# Patient Record
Sex: Male | Born: 1965 | Race: Black or African American | Hispanic: No | Marital: Married | Smoking: Never smoker
Health system: Southern US, Community
[De-identification: ages and names within clinical notes are randomized; demographics above are authoritative.]

## PROBLEM LIST (undated history)

## (undated) DIAGNOSIS — K3184 Gastroparesis: Secondary | ICD-10-CM

## (undated) DIAGNOSIS — Z8679 Personal history of other diseases of the circulatory system: Secondary | ICD-10-CM

## (undated) DIAGNOSIS — N529 Male erectile dysfunction, unspecified: Secondary | ICD-10-CM

---

## 1999-07-30 ENCOUNTER — Emergency Department (HOSPITAL_COMMUNITY): Admission: EM | Admit: 1999-07-30 | Discharge: 1999-07-30 | Payer: Self-pay | Admitting: Emergency Medicine

## 2005-04-23 ENCOUNTER — Emergency Department (HOSPITAL_COMMUNITY): Admission: EM | Admit: 2005-04-23 | Discharge: 2005-04-23 | Payer: Self-pay | Admitting: Emergency Medicine

## 2011-01-14 ENCOUNTER — Emergency Department (HOSPITAL_COMMUNITY)
Admission: EM | Admit: 2011-01-14 | Discharge: 2011-01-15 | Disposition: A | Discharge status: Home / Self Care | Payer: 59 | Attending: Emergency Medicine | Admitting: Emergency Medicine

## 2011-01-14 DIAGNOSIS — R109 Unspecified abdominal pain: Secondary | ICD-10-CM | POA: Insufficient documentation

## 2011-01-15 ENCOUNTER — Emergency Department (HOSPITAL_COMMUNITY): Payer: 59

## 2011-01-15 LAB — URINALYSIS, ROUTINE W REFLEX MICROSCOPIC
Bilirubin Urine: NEGATIVE
Hgb urine dipstick: NEGATIVE
Ketones, ur: 15 mg/dL — AB
Nitrite: NEGATIVE
Protein, ur: NEGATIVE mg/dL
Urobilinogen, UA: 1 mg/dL (ref 0.0–1.0)

## 2011-01-15 LAB — CBC
Hemoglobin: 16.1 g/dL (ref 13.0–17.0)
MCH: 27.7 pg (ref 26.0–34.0)
Platelets: 303 10*3/uL (ref 150–400)
RBC: 5.82 MIL/uL — ABNORMAL HIGH (ref 4.22–5.81)
WBC: 6.3 10*3/uL (ref 4.0–10.5)

## 2011-01-15 LAB — TROPONIN I: Troponin I: 0.01 ng/mL (ref 0.00–0.06)

## 2011-01-15 LAB — COMPREHENSIVE METABOLIC PANEL
ALT: 19 U/L (ref 0–53)
Albumin: 3.9 g/dL (ref 3.5–5.2)
Alkaline Phosphatase: 66 U/L (ref 39–117)
BUN: 10 mg/dL (ref 6–23)
CO2: 24 mEq/L (ref 19–32)
Calcium: 9.3 mg/dL (ref 8.4–10.5)
GFR calc Af Amer: >60 mL/min (ref >60)
Glucose, Bld: 123 mg/dL — ABNORMAL HIGH (ref 70–99)

## 2011-01-15 LAB — URINE MICROSCOPIC-ADD ON

## 2011-01-15 LAB — DIFFERENTIAL
Eosinophils Relative: 2 % (ref 0–5)
Lymphs Abs: 1.2 10*3/uL (ref 0.7–4.0)
Neutrophils Relative %: 67 % (ref 43–77)

## 2011-01-15 LAB — CK TOTAL AND CKMB (NOT AT ARMC)
CK, MB: 0.7 ng/mL (ref 0.3–4.0)
Relative Index: 0.7 (ref 0.0–2.5)
Total CK: 107 U/L (ref 7–232)

## 2011-01-15 LAB — LIPASE, BLOOD: Lipase: 18 U/L (ref 11–59)

## 2011-01-17 ENCOUNTER — Ambulatory Visit (HOSPITAL_COMMUNITY)
Admission: AD | Admit: 2011-01-17 | Discharge: 2011-01-21 | Disposition: A | Discharge status: Home / Self Care | Payer: 59 | Source: Ambulatory Visit | Attending: Surgery | Admitting: Surgery

## 2011-01-17 ENCOUNTER — Ambulatory Visit (HOSPITAL_COMMUNITY): Admission: AD | Admit: 2011-01-17 | Payer: Self-pay | Source: Ambulatory Visit | Admitting: Surgery

## 2011-01-17 ENCOUNTER — Other Ambulatory Visit: Payer: Self-pay | Admitting: Surgery

## 2011-01-17 DIAGNOSIS — K560 Paralytic ileus: Secondary | ICD-10-CM | POA: Insufficient documentation

## 2011-01-17 DIAGNOSIS — K812 Acute cholecystitis with chronic cholecystitis: Secondary | ICD-10-CM | POA: Insufficient documentation

## 2011-01-17 DIAGNOSIS — I10 Essential (primary) hypertension: Secondary | ICD-10-CM | POA: Insufficient documentation

## 2011-01-17 DIAGNOSIS — R1011 Right upper quadrant pain: Secondary | ICD-10-CM | POA: Insufficient documentation

## 2011-01-17 DIAGNOSIS — R112 Nausea with vomiting, unspecified: Secondary | ICD-10-CM | POA: Insufficient documentation

## 2011-01-17 DIAGNOSIS — E785 Hyperlipidemia, unspecified: Secondary | ICD-10-CM | POA: Insufficient documentation

## 2011-01-17 DIAGNOSIS — Z01812 Encounter for preprocedural laboratory examination: Secondary | ICD-10-CM | POA: Insufficient documentation

## 2011-01-17 HISTORY — PX: LAPAROSCOPIC CHOLECYSTECTOMY: SUR755

## 2011-01-17 LAB — SURGICAL PCR SCREEN: MRSA, PCR: NEGATIVE

## 2011-01-19 LAB — COMPREHENSIVE METABOLIC PANEL
ALT: 51 U/L (ref 0–53)
AST: 25 U/L (ref 0–37)
Calcium: 8.5 mg/dL (ref 8.4–10.5)
Creatinine, Ser: 1.16 mg/dL (ref 0.4–1.5)
GFR calc Af Amer: >60 mL/min (ref >60)
Glucose, Bld: 90 mg/dL (ref 70–99)
Sodium: 137 mEq/L (ref 135–145)
Total Protein: 5.9 g/dL — ABNORMAL LOW (ref 6.0–8.3)

## 2011-01-19 LAB — CBC
MCH: 27.3 pg (ref 26.0–34.0)
RBC: 5.49 MIL/uL (ref 4.22–5.81)
RDW: 12.8 % (ref 11.5–15.5)
WBC: 7.1 10*3/uL (ref 4.0–10.5)

## 2011-01-20 ENCOUNTER — Ambulatory Visit (HOSPITAL_COMMUNITY): Payer: 59

## 2011-01-22 ENCOUNTER — Emergency Department (HOSPITAL_COMMUNITY): Payer: 59

## 2011-01-22 ENCOUNTER — Emergency Department (HOSPITAL_COMMUNITY)
Admission: EM | Admit: 2011-01-22 | Discharge: 2011-01-22 | Disposition: A | Discharge status: Home / Self Care | Payer: 59 | Attending: Emergency Medicine | Admitting: Emergency Medicine

## 2011-01-22 DIAGNOSIS — E7800 Pure hypercholesterolemia, unspecified: Secondary | ICD-10-CM | POA: Insufficient documentation

## 2011-01-22 DIAGNOSIS — R1032 Left lower quadrant pain: Secondary | ICD-10-CM | POA: Insufficient documentation

## 2011-01-22 DIAGNOSIS — Z9089 Acquired absence of other organs: Secondary | ICD-10-CM | POA: Insufficient documentation

## 2011-01-22 DIAGNOSIS — R112 Nausea with vomiting, unspecified: Secondary | ICD-10-CM | POA: Insufficient documentation

## 2011-01-22 LAB — CBC
MCH: 27.1 pg (ref 26.0–34.0)
MCHC: 34.2 g/dL (ref 30.0–36.0)
MCV: 79.3 fL (ref 78.0–100.0)
Platelets: 233 10*3/uL (ref 150–400)
RDW: 12.5 % (ref 11.5–15.5)
WBC: 9.9 10*3/uL (ref 4.0–10.5)

## 2011-01-22 LAB — URINALYSIS, ROUTINE W REFLEX MICROSCOPIC
Ketones, ur: >80 mg/dL — AB
Nitrite: NEGATIVE
Protein, ur: NEGATIVE mg/dL

## 2011-01-22 LAB — DIFFERENTIAL
Lymphs Abs: 0.5 10*3/uL — ABNORMAL LOW (ref 0.7–4.0)
Monocytes Absolute: 1.1 10*3/uL — ABNORMAL HIGH (ref 0.1–1.0)
Monocytes Relative: 11 % (ref 3–12)
Neutrophils Relative %: 84 % — ABNORMAL HIGH (ref 43–77)

## 2011-01-22 LAB — BASIC METABOLIC PANEL
BUN: 14 mg/dL (ref 6–23)
Creatinine, Ser: 0.83 mg/dL (ref 0.4–1.5)
GFR calc non Af Amer: >60 mL/min (ref >60)

## 2011-01-22 MED ORDER — IOHEXOL 300 MG/ML  SOLN
100.0000 mL | Freq: Once | INTRAMUSCULAR | Status: AC | PRN
  Administered 2011-01-22: 100 mL via INTRAVENOUS

## 2011-01-23 NOTE — H&P (Signed)
Logan Arnold, Logan Arnold              ACCOUNT NO.:  [id number]  MEDICAL RECORD NO.:  [id number]           PATIENT TYPE:  O  LOCATION:  1536                         FACILITY:  [facility]  PHYSICIAN:  Abigail Miyamoto, M.D. DATE OF BIRTH:  1966/08/03  DATE OF ADMISSION:  01/17/2011 DATE OF DISCHARGE:                             HISTORY & PHYSICAL   CHIEF COMPLAINT:  Right upper quadrant abdominal pain, nausea, and vomiting.  HISTORY:  This is a 45 year old gentleman who presents with several week history of right upper quadrant abdominal pain, nausea, and vomiting. He actually presented to the emergency department 2 days ago with the same complaint.  He was then sent to Dr. Charna Elizabeth today.  After he was evaluated by Dr. Loreta Ave, Dr. Loreta Ave called me with concerns that [name] may have cholecystitis based on his examination.  He was therefore sent over to short stay.  The patient reports again he has had pain in the right upper quadrant, which has been intermittent over the last several weeks.  He has had nausea and vomiting with this.  He has been on antacid medications which has not relieved his symptoms.  He denies any jaundice with this.  The pain could be severe at times and again is intermittent and sharp.  Otherwise he is without complaints.  PAST MEDICAL HISTORY:  High cholesterol and hypertension.  PAST SURGICAL HISTORY:  Negative.  MEDICATIONS:  Lipitor.  ALLERGIES:  No known drug allergies.  SOCIAL HISTORY:  He does not smoke.  He drinks alcohol on a social basis.  FAMILY HISTORY:  Significant for diabetes and hypertension.  REVIEW OF SYSTEMS:  GENERAL:  Negative for fevers or chills.  PULMONARY: Negative for cough, shortness breath or difficulty breathing.  CARDIAC: Negative chest pain or irregular heartbeat.  ABDOMEN:  Listed as above. There is no hematemesis.  He has had diarrhea.  There is no blood in the stool.  Urinary is negative for dysuria or hematuria.  The  rest of review of systems of skin, eyes, ears, nose, and throat, musculoskeletal, neurologic, psychiatric, endocrine are normal.  PHYSICAL EXAMINATION:  GENERAL:  This is a  well-developed, well- nourished gentleman in obvious discomfort. VITAL SIGNS:  Temperature 97.0, pulse 105, blood pressure 154/107, respiratory rate 18.  He is saturating 98% on room air. HEENT:  Eyes anicteric.  Pupils reactive bilaterally.  ENT, external ears and nose normal.  Hearing is normal.  Oropharynx clear. NECK:  Supple.  Trachea is midline.  There is no thyromegaly. LUNGS:  Clear to auscultation bilaterally.  There is normal respiratory effort. CARDIOVASCULAR:  Tachycardic with regular rhythm.  There are no murmurs. There is no peripheral edema. ABDOMEN:  Soft.  There is tenderness with guarding which is moderate in the right upper quadrant.  The rest of the abdomen is soft and nontender.  There are no hernias.  There is no organomegaly. SKIN:  Shows no rashes, no jaundice. EXTREMITIES:  Warm, well perfused.  No edema, clubbing, or cyanosis. Peripheral pulses are intact throughout.  DATA REVIEWED:  I have the labs from 2 days ago which shows a normal white blood count  of 6.3, hemoglobin of 16.1, lipase is normal, bilirubin is 0.6, alkaline phosphatase 66, AST 10, ALT 19.  The patient has an ultrasound of the gallbladder showing to have gallbladder sludge. There are no other abnormalities.  IMPRESSION:  This is a patient with cholecystitis based on history and physical examination.  At this point, I would recommend a laparoscopic cholecystectomy and possible cholangiogram.  I have discussed this with the patient in detail.  I have discussed the risks of surgery which include but not limited to bleeding, infection, bile duct injury, bile leak, injury to other structures, need for conversion to an open procedure, and the chance that it may not resolve all his symptoms.  He understands and wished to  proceed.  Surgery will thus be performed.     Abigail Miyamoto, M.D.     DB/MEDQ  D:  01/17/2011  T:  01/17/2011  Job:  [id number]  cc:   Anselmo Rod, MD, [facility] Fax: [phone]  Electronically Signed by Abigail Miyamoto M.D. on 01/23/2011 01:56:15 PM

## 2011-01-23 NOTE — Op Note (Signed)
  NAMEMINER, KORAL              ACCOUNT NO.:  [id number]  MEDICAL RECORD NO.:  [id number]           PATIENT TYPE:  O  LOCATION:  DAYL                         FACILITY:  [facility]  PHYSICIAN:  Abigail Miyamoto, M.D. DATE OF BIRTH:  1966-03-04  DATE OF PROCEDURE:  01/17/2011 DATE OF DISCHARGE:                              OPERATIVE REPORT   PREOPERATIVE DIAGNOSIS:  Cholecystitis.  POSTOPERATIVE DIAGNOSIS:  Cholecystitis.  PROCEDURE:  Laparoscopic cholecystectomy.  SURGEON:  Abigail Miyamoto, M.D.  ASSISTANT:  Eber Hong, P.A.  ANESTHESIA:  General endotracheal anesthesia.  ESTIMATED BLOOD LOSS:  Minimal.  FINDINGS:  The patient was found to have acute cholecystitis with thickened gallbladder wall.  PROCEDURE IN DETAIL:  The patient was brought to the operative room, identified as Logan Arnold.  He was placed supine on the operating table and general anesthesia was induced.  His abdomen was then prepped and draped in the usual sterile fashion.  Using #15 blade, a small vertical incision was made below the umbilicus.  This was carried down to fascia which was then opened with a scalpel.  A hemostat was used to pass through the peritoneal cavity under direct vision.  Next, 0 Vicryl pursestring sutures were placed around the fascial opening.  The Hasson port was placed through the opening and insufflation of the abdomen was begun.  A 5-mm port was placed to the patient's epigastrium and 2 more to the right upper quadrant, all under direct vision.  The gallbladder was then identified, grasped and retracted above the liver bed. Dissection was then carried out at the base of the gallbladder.  The gallbladder itself was found be acutely inflamed with edematous wall. The cystic duct was easily dissected out along with the cystic artery and a critical window was achieved around both.  I clipped the cystic duct 3 times proximally and the artery 3 times proximally and then  both twice distally and transected both the cystic duct and the cystic artery.  The gallbladder was then slowly dissected free from the liver bed with electrocautery.  Once it was freed from the liver bed, it was placed in an Endosac and removed through the incision at the umbilicus. With 0 Vicryl, the umbilicus was then tied in place closing the fascial defect.  I again examined liver bed, hemostasis was felt to be achieved. I then thoroughly irrigated the abdomen with normal saline.  All ports were removed under direct vision and the abdomen was deflated.  All incisions were anesthetized with Marcaine and closed with 4-0 Monocryl subcuticular sutures.  Steri-Strips and Band-Aids were then applied.  The patient the procedure well.  All counts were correct at the end of procedure.  The patient was then extubated in the operating room and taken in a stable condition to recovery room.     Abigail Miyamoto, M.D.     DB/MEDQ  D:  01/17/2011  T:  01/17/2011  Job:  [id number]  Electronically Signed by Abigail Miyamoto M.D. on 01/23/2011 01:56:17 PM

## 2011-01-28 ENCOUNTER — Emergency Department (HOSPITAL_COMMUNITY): Payer: 59

## 2011-01-28 ENCOUNTER — Observation Stay (HOSPITAL_COMMUNITY)
Admission: EM | Admit: 2011-01-28 | Discharge: 2011-02-01 | Disposition: A | Discharge status: Home / Self Care | Payer: 59 | Attending: Internal Medicine | Admitting: Internal Medicine

## 2011-01-28 DIAGNOSIS — E785 Hyperlipidemia, unspecified: Secondary | ICD-10-CM | POA: Insufficient documentation

## 2011-01-28 DIAGNOSIS — R112 Nausea with vomiting, unspecified: Secondary | ICD-10-CM | POA: Insufficient documentation

## 2011-01-28 DIAGNOSIS — K219 Gastro-esophageal reflux disease without esophagitis: Secondary | ICD-10-CM | POA: Insufficient documentation

## 2011-01-28 DIAGNOSIS — E872 Acidosis, unspecified: Secondary | ICD-10-CM | POA: Insufficient documentation

## 2011-01-28 DIAGNOSIS — Z9889 Other specified postprocedural states: Secondary | ICD-10-CM | POA: Insufficient documentation

## 2011-01-28 DIAGNOSIS — K3184 Gastroparesis: Principal | ICD-10-CM | POA: Insufficient documentation

## 2011-01-28 LAB — COMPREHENSIVE METABOLIC PANEL
Albumin: 3.6 g/dL (ref 3.5–5.2)
Alkaline Phosphatase: 75 U/L (ref 39–117)
BUN: 23 mg/dL (ref 6–23)
Calcium: 9.5 mg/dL (ref 8.4–10.5)
Potassium: 4.4 mEq/L (ref 3.5–5.1)
Total Protein: 6.8 g/dL (ref 6.0–8.3)

## 2011-01-28 LAB — CBC
MCHC: 35.3 g/dL (ref 30.0–36.0)
MCV: 79.2 fL (ref 78.0–100.0)
Platelets: 252 10*3/uL (ref 150–400)
RDW: 12.8 % (ref 11.5–15.5)
WBC: 5.7 10*3/uL (ref 4.0–10.5)

## 2011-01-28 LAB — DIFFERENTIAL
Basophils Absolute: 0 10*3/uL (ref 0.0–0.1)
Eosinophils Absolute: 0.2 10*3/uL (ref 0.0–0.7)
Eosinophils Relative: 3 % (ref 0–5)
Lymphs Abs: 1 10*3/uL (ref 0.7–4.0)
Monocytes Absolute: 1.3 10*3/uL — ABNORMAL HIGH (ref 0.1–1.0)

## 2011-01-28 LAB — LIPASE, BLOOD: Lipase: 30 U/L (ref 11–59)

## 2011-01-28 LAB — URINALYSIS, ROUTINE W REFLEX MICROSCOPIC
Glucose, UA: NEGATIVE mg/dL
Ketones, ur: 15 mg/dL — AB
Nitrite: NEGATIVE
Specific Gravity, Urine: 1.029 (ref 1.005–1.030)
pH: 6 (ref 5.0–8.0)

## 2011-01-28 LAB — LACTIC ACID, PLASMA: Lactic Acid, Venous: 0.7 mmol/L (ref 0.5–2.2)

## 2011-01-28 MED ORDER — TECHNETIUM TC 99M MEBROFENIN IV KIT
5.0000 | PACK | Freq: Once | INTRAVENOUS | Status: AC | PRN
  Administered 2011-01-28: 5 via INTRAVENOUS

## 2011-01-29 LAB — COMPREHENSIVE METABOLIC PANEL
ALT: 48 U/L (ref 0–53)
AST: 13 U/L (ref 0–37)
Albumin: 2.4 g/dL — ABNORMAL LOW (ref 3.5–5.2)
CO2: 26 mEq/L (ref 19–32)
Chloride: 103 mEq/L (ref 96–112)
Creatinine, Ser: 1 mg/dL (ref 0.4–1.5)
GFR calc Af Amer: >60 mL/min (ref >60)
GFR calc non Af Amer: >60 mL/min (ref >60)
Potassium: 3.5 mEq/L (ref 3.5–5.1)
Sodium: 136 mEq/L (ref 135–145)
Total Bilirubin: 0.4 mg/dL (ref 0.3–1.2)

## 2011-01-29 LAB — CBC
Hemoglobin: 13.9 g/dL (ref 13.0–17.0)
Platelets: 203 10*3/uL (ref 150–400)
RBC: 5.13 MIL/uL (ref 4.22–5.81)
WBC: 4.9 10*3/uL (ref 4.0–10.5)

## 2011-01-29 NOTE — H&P (Signed)
Logan Arnold, Logan Arnold              ACCOUNT NO.:  [id number]  MEDICAL RECORD NO.:  [id number]           PATIENT TYPE:  E  LOCATION:  MCED                         FACILITY:  [facility]  PHYSICIAN:  Lonia Blood, M.D.       DATE OF BIRTH:  Feb 18, 1966  DATE OF ADMISSION:  01/28/2011 DATE OF DISCHARGE:                             HISTORY & PHYSICAL   PRIMARY CARE PHYSICIAN:  Tally Joe, M.D. with [facility]  CHIEF COMPLAINT:  Nausea and vomiting.  HISTORY OF PRESENT ILLNESS:  Logan Arnold is a 45 year old gentleman who has had a fairly eventful past 2 weeks.  He started with some significant nausea and vomiting green bile 1 week prior to January 17, 2011.  He went and saw Dr. Loreta Ave in the office and was referred to the hospital for possible acute cholecystitis.  He was admitted on January 17, 2011, underwent an abdominal ultrasound on January 15, 2011 and there were no suggestions of cholecystitis.  He continued to have significant nausea and vomiting with green bile.  On January 18, 2011, he underwent laparoscopic cholecystectomy.  Postoperatively, he had some minor improvement in his symptoms and he was eventually successfully discharge on January 21, 2011.  He developed some minor postoperative ileus, but overall he was successfully discharged home.  He presented back to the emergency room on Jan 22, 2011, had a CT scan of abdomen and pelvis which was negative for any acute intraabdominal pathology namely any gallbladder fossa abscess.  He then went on to present today with persistent 1 week of nausea, vomiting, and inability to eat any food and underwent a hepatobiliary HIDA scan which was negative for a bile leak. As he was unable to eat, he was referred to [place] for further diagnostic investigations and observation.  The patient reports that he has no significant past medical history.  He does not drink and does not smoke and that he has some epigastric pain nonradiating, 5/10, associated with this  persistent nausea and vomiting.  PAST MEDICAL HISTORY:  Hyperlipidemia.  HOME MEDICATIONS:  Lipitor.  SOCIAL HISTORY:  He is a Emergency planning/management officer, does not smoke, does not drink significantly, does not use drugs.  FAMILY HISTORY:  Mother is alive at age 26 with Parkinson.  He has a history of breast cancer.  Father is aged 12, alive with diabetes, hypertension.  One brother and two sisters alive and well.  REVIEW OF SYSTEMS:  As per HPI.  All other systems reviewed are negative.  PHYSICAL EXAMINATION:  VITAL SIGNS:  Upon admission, temperature 98.1, blood pressure 140/90, pulse 117, respirations 22, saturation 97% on room air. GENERAL APPEARANCE:  This is a well known well-developed, well-nourished gentleman in some moderate distress due to his persistent nausea who is lying on the stretcher.  He is alert and oriented to place, person, and time. HEENT:  Head is normocephalic, atraumatic.  Eyes:  PERRL, equal, round and reactive to light and accommodation.  Extraocular movements are intact.  Buccal mucosa dry. NECK:  Supple.  No JVD. CHEST:  Clear to auscultation.  No wheezes.  No crackles. HEART:  Regular rate and  rhythm, without murmurs, rubs or gallops. ABDOMEN:  Soft.  There is some epigastric tenderness without rebound or guarding.  Bowel sounds are diminished. EXTREMITIES:  Lower extremities are without edema. SKIN:  Warm and dry without suspicious looking rashes. NEUROLOGIC:  Cranial nerves III through XII intact. Strength 4/5 in all four extremities.  Sensation intact.  LABORATORY VALUES ON ADMISSION:  White blood cell count 5.7, hemoglobin 18.8, platelet count is 252.  Sodium is 131, potassium 4.4, chloride 95, bicarbonate 19, BUN 23, creatinine 1.1, calcium 9.5, albumin 3.6, lipase 30.  ALT 103, albumin 3.6, calcium 9.5.  Urinalysis positive for ketones.  Acute abdominal series shows a clear chest.  No free air.  A review of the film indicates significant constipation  with desiccated stool is present throughout the colon.  ASSESSMENT AND PLAN:  This is a 45 year old gentleman who presents with significant persistent nausea and epigastric pain.  His objective abnormalities also include hyponatremia, dehydration, metabolic acidosis with an anion gap of 14, severe constipation, and hyponatremia.  I think at this point in time, the differential diagnosis includes severe constipation with pseudoobstruction versus gastritis/peptic ulcer disease, rule out esophagitis.  I doubt that this is cholangitis or pancreatitis.  I also strongly doubt presence of appendicitis.  I think the sensible thing to do for this patient tonight is admit him to the hospital, hydrate vigorously with IV fluids, place him on proton-pump inhibitor namely Protonix intravenously and give him MiraLax 34 g.  He is going to be seen in consultation by Dr. Loreta Ave from Gastroenterology who is going to prep him for an endoscopy tomorrow.     Lonia Blood, M.D.     SL/MEDQ  D:  01/28/2011  T:  01/28/2011  Job:  [id number]  cc:   Tally Joe, M.D. Anselmo Rod, MD, [facility]  Electronically Signed by Lonia Blood M.D. on 01/29/2011 05:48:05 PM

## 2011-01-30 ENCOUNTER — Observation Stay (HOSPITAL_COMMUNITY): Payer: 59

## 2011-01-30 LAB — BASIC METABOLIC PANEL
CO2: 31 mEq/L (ref 19–32)
Calcium: 8.5 mg/dL (ref 8.4–10.5)
Creatinine, Ser: 0.9 mg/dL (ref 0.4–1.5)
GFR calc Af Amer: >60 mL/min (ref >60)
GFR calc non Af Amer: >60 mL/min (ref >60)
Glucose, Bld: 88 mg/dL (ref 70–99)
Sodium: 136 mEq/L (ref 135–145)

## 2011-01-30 LAB — CBC
HCT: 44.7 % (ref 39.0–52.0)
Hemoglobin: 15.3 g/dL (ref 13.0–17.0)
MCH: 27.8 pg (ref 26.0–34.0)
MCHC: 34.2 g/dL (ref 30.0–36.0)
RDW: 12.7 % (ref 11.5–15.5)

## 2011-01-30 NOTE — Discharge Summary (Signed)
  Logan Arnold, DEREMER              ACCOUNT NO.:  [id number]  MEDICAL RECORD NO.:  [id number]           PATIENT TYPE:  O  LOCATION:  1536                         FACILITY:  [facility]  PHYSICIAN:  Abigail Miyamoto, M.D. DATE OF BIRTH:  [birth year]  DATE OF ADMISSION:  01/17/2011 DATE OF DISCHARGE:                              DISCHARGE SUMMARY   DATE OF EXPECTED DISCHARGE:  January 18, 2011.  ADMISSION DIAGNOSIS:  Acute cholecystitis.  DISCHARGING DIAGNOSIS:  Acute cholecystitis.  ADDITIONAL DIAGNOSES:  Dyslipidemia and hypertension.  PAST SURGICAL HISTORY:  None.  MEDICATIONS ON ADMISSION:  Lipitor  ALLERGIES:  None known.  BRIEF HISTORY:  The patient is a 45 year old gentleman who had several weeks of right upper quadrant abdominal pain, nausea, and vomiting.  He actually presented to the emergency room 2 days ago with the same complaint.  He was sent to Dr. Charna Elizabeth for evaluation and was seen in her office on the day of admission.  She contacted Dr. Magnus Ivan who felt that he had significant discomfort.  The patient was subsequently transferred to [facility] and admitted as an outpatient for elective cholecystectomy.  HOSPITAL COURSE:  The patient was admitted, taken directly to the OR where he underwent laparoscopic cholecystectomy.  The patient tolerated the procedure well.  He returned to the floor in satisfactory condition. He has been hemodynamically stable with blood pressures borderline elevated.  He had some clear liquids this morning and had significant nausea and vomiting.  He felt better after vomiting and Zofran. Currently, he is pain-free.  His nausea and vomiting does not seem to be related to his pain medicine.  We are going to reintroduce liquids, mobilize him and advance his diet as tolerated.  If he does well, we will discharge later today.  His wounds are steri-stripped and at this point the dressings are clean and dry.  DISCHARGE MEDS:  We  will send him home on Vicodin 1 to 2 q.4 p.r.n. for pain, ibuprofen 200 mg 2 to 3 tablets q.6 p.r.n. or Tylenol 325 two tablets q.4 p.r.n. for pain.  He will follow up with Dr. Azucena Cecil at his convenience.  We will have him follow up with Dr. Magnus Ivan in the office in 2 to 3 weeks.  CONDITION ON DISCHARGE:  Improved.  FOLLOWUP:  Followup appointment will be Jan 30, 2011, at 3:40 with Dr. Magnus Ivan.     Eber Hong, P.A.   ______________________________ Abigail Miyamoto, M.D.    WDJ/MEDQ  D:  01/18/2011  T:  01/18/2011  Job:  [id number]  cc:   Tally Joe, M.D. Fax: [phone]  WGNFAO ZHY QMVH, MD, [facility] Fax: [phone]  Electronically Signed by Sherrie George P.A. on 01/26/2011 08:10:30 PM Electronically Signed by Abigail Miyamoto M.D. on 01/30/2011 10:37:03 AM

## 2011-01-31 ENCOUNTER — Observation Stay (HOSPITAL_COMMUNITY): Payer: 59

## 2011-01-31 LAB — COMPREHENSIVE METABOLIC PANEL
BUN: 8 mg/dL (ref 6–23)
CO2: 28 mEq/L (ref 19–32)
Calcium: 8.7 mg/dL (ref 8.4–10.5)
Creatinine, Ser: 0.97 mg/dL (ref 0.4–1.5)
GFR calc non Af Amer: >60 mL/min (ref >60)
Glucose, Bld: 94 mg/dL (ref 70–99)
Total Protein: 5.7 g/dL — ABNORMAL LOW (ref 6.0–8.3)

## 2011-01-31 LAB — CBC
HCT: 45 % (ref 39.0–52.0)
Hemoglobin: 15.2 g/dL (ref 13.0–17.0)
MCH: 27.1 pg (ref 26.0–34.0)
MCHC: 33.8 g/dL (ref 30.0–36.0)
RDW: 12.7 % (ref 11.5–15.5)

## 2011-01-31 LAB — HIV ANTIBODY (ROUTINE TESTING W REFLEX): HIV: NONREACTIVE

## 2011-01-31 MED ORDER — TECHNETIUM TC 99M SULFUR COLLOID: 2.0000 | Freq: Once | INTRAVENOUS | Status: AC | PRN

## 2011-02-01 LAB — VITAMIN B12: Vitamin B-12: 1980 pg/mL — ABNORMAL HIGH (ref 211–911)

## 2011-02-05 ENCOUNTER — Other Ambulatory Visit: Payer: Self-pay | Admitting: Gastroenterology

## 2011-02-05 ENCOUNTER — Ambulatory Visit
Admission: RE | Admit: 2011-02-05 | Discharge: 2011-02-05 | Disposition: A | Discharge status: Home / Self Care | Payer: 59 | Source: Ambulatory Visit | Attending: Gastroenterology | Admitting: Gastroenterology

## 2011-02-05 DIAGNOSIS — R1110 Vomiting, unspecified: Secondary | ICD-10-CM

## 2011-02-05 NOTE — Discharge Summary (Signed)
  Logan Arnold, Logan Arnold              ACCOUNT NO.:  [id number]  MEDICAL RECORD NO.:  [id number]           PATIENT TYPE:  O  LOCATION:  1536                         FACILITY:  [facility]  PHYSICIAN:  Ardeth Sportsman, MD     DATE OF BIRTH:  [birth year]  DATE OF ADMISSION:  01/17/2011 DATE OF DISCHARGE:  01/21/2011                              DISCHARGE SUMMARY   ADDENDUM: This is an addendum of the discharge summary dictated on January 18, 2011.  ADMISSION DIAGNOSIS:  Acute cholecystitis.  DISCHARGE DIAGNOSES: 1. Acute cholecystitis. 2. Postoperative ileus. 3. Dyslipidemia. 4. Hypertension.  HOSPITAL COURSE:  The patient was admitted, underwent laparoscopic cholecystectomy on April 26.  We anticipated discharge on April 27, but postoperatively he had some nausea and vomiting, was unable to eat.  We kept him n.p.o. and tried him on clear liquids.  He had problems with ongoing nausea & vomiting.    He was ambulating well.  His wounds looked good.  Labs were repeated on January 19, 2011, white count was normal at 7.1, hemoglobin 15, hematocrit 44.  Electrolytes were normal.  LFTs were also normal. Lipase was negative at 23.  Underwent a flat plate, & abdominal ultrasound on January 20, 2011.  This showed no discrete fluid collection in gallbladder fossa.  The bile duct was 7.7 mm in diameter with no obstruction, and there was no free fluid.  The flat plate was also normal.  The patient subsequently got his diet advanced again.  This morning, he was able to eat a soft diet. His post-op ileus was resolving. He still had not had a bowel movement.  We gave him a laxative, he successfully had a bowel movement and by lunch time was ready for discharge.  He will be discharged on Vicodin 1 to 2 q.4, ibuprofen 2 to 3 tablets q.6 or Tylenol 2 tablets q.4 p.r.n. for pain.  He will follow up with Dr. Magnus Ivan on Jan 28, 2011 at 3:40.  He can resume regular diet, shower, remove the Steri-Strips in 1 week.  He  can go back to work in 1 week.     Logan Arnold, P.A.   ______________________________ Ardeth Sportsman, MD    WDJ/MEDQ  D:  01/21/2011  T:  01/21/2011  Job:  [id number]  cc:   Anselmo Rod, MD, [facility] Fax: [phone]  Electronically Signed by Sherrie George P.A. on 01/26/2011 08:18:00 PM Electronically Signed by Karie Soda MD on 02/05/2011 12:03:43 PM

## 2011-02-11 NOTE — Consult Note (Signed)
Logan Arnold, Logan Arnold              ACCOUNT NO.:  [id number]  MEDICAL RECORD NO.:  [id number]           PATIENT TYPE:  LOCATION:                                 FACILITY:  PHYSICIAN:  Jordan Hawks. Elnoria Howard, MD    DATE OF BIRTH:  11-26-1965  DATE OF CONSULTATION:  01/29/2011 DATE OF DISCHARGE:                                CONSULTATION   REASON FOR CONSULTATION:  Epigastric pain, nausea and vomiting, and 25- pound weight loss.  HISTORY OF PRESENT ILLNESS:  This is a 45 year old gentleman without any significant past medical history who started to have acute symptoms of nausea, vomiting, and abdominal pain; and on January 17, 2011, the patient's symptoms started acutely; and at that time, he was evaluated by Dr. Loreta Ave and was thought to have an acute cholecystitis. Unfortunately, workup was negative for this issue, but subsequently he did undergo a laparoscopic cholecystectomy.  Unfortunately, the procedure did not resolve his symptoms and he continued to have this type of abdominal pain, nausea, and vomiting.  Per the patient's recollection, he believes that he has lost approximately 25 pounds as a result of his symptoms.  There are no complaints of any dysphagia or odynophagia.  No fevers or chills.  He also had a postoperative HIDA scan which was negative for any evidence of biliary leak.  The pain is intermittent and it can worsen without any provocation.  At times, he will have issues with singultus which causes an epigastric type pain. Currently, he does not have any pain.  Past medical history and past surgical history is significant for hyperlipidemia.  SOCIAL HISTORY:  Negative for alcohol, tobacco, illicit drug use.  He is a Emergency planning/management officer.  REVIEW OF SYSTEMS:  As stated above in history present illness, otherwise negative.  MEDICATIONS: 1. Protonix 40 mg IV q.12 h. 2. MiraLax 34 mg p.o. daily. 3. Tylenol 650 mg n.p.o. q.4 h. p.r.n. 4. Dilaudid 0.5 to 1 mg IV q.4 h.  p.r.n. 5. Zofran 4 mg IV q.6 h. p.r.n. 6. Phenergan 12.5 mg IV q.6 h. p.r.n.  ALLERGIES:  No known drug allergies.  PHYSICAL EXAMINATION:  VITAL SIGNS:  Blood pressure is 149/87, heart rate 73, respirations 20, temperature is 98.3. GENERAL:  The patient is in no acute distress, alert and oriented. HEENT:  Normocephalic, atraumatic.  Extraocular muscles intact. NECK:  Supple.  No lymphadenopathy. LUNGS:  Clear to auscultation bilaterally. CARDIOVASCULAR:  Regular rate and rhythm. ABDOMEN:  Flat, soft.  No significant tenderness in the epigastric region.  No rebound or rigidity. EXTREMITIES:  No clubbing, cyanosis, or edema.  LABORATORY VALUES:  White blood cell count is 4.9, hemoglobin 13.9, MCV is 81.9, platelets at 203.  Sodium is 136, potassium 3.5, chloride 103, CO2 26, glucose 84, BUN 10, creatinine is 1.0.  Total bilirubin 0.4, alk phos 54, AST 13, ALT 48, and albumin 2.4.  IMPRESSION: 1. Nausea and vomiting. 2. Epigastric pain. 3. Weight loss.  Unfortunately, the etiology of his symptoms is unknown at this time despite extensive workup.  The imaging scans did not reveal any overt findings to clarify  this issue.  He  has not had an endoscopy at this time; and after discussion with Dr. Loreta Ave, the plan is to perform an EGD.  PLAN:  EGD and further recommendations pending the findings.     Jordan Hawks Elnoria Howard, MD     PDH/MEDQ  D:  01/29/2011  T:  01/30/2011  Job:  [id number]  Electronically Signed by Jeani Hawking MD on 02/11/2011 06:49:04 AM

## 2011-03-09 ENCOUNTER — Emergency Department (HOSPITAL_COMMUNITY): Payer: 59

## 2011-03-09 ENCOUNTER — Observation Stay (HOSPITAL_COMMUNITY)
Admission: EM | Admit: 2011-03-09 | Discharge: 2011-03-11 | Disposition: A | Discharge status: Home / Self Care | Payer: 59 | Attending: Internal Medicine | Admitting: Internal Medicine

## 2011-03-09 DIAGNOSIS — S0300XA Dislocation of jaw, unspecified side, initial encounter: Secondary | ICD-10-CM | POA: Insufficient documentation

## 2011-03-09 DIAGNOSIS — X58XXXA Exposure to other specified factors, initial encounter: Secondary | ICD-10-CM | POA: Insufficient documentation

## 2011-03-09 DIAGNOSIS — E785 Hyperlipidemia, unspecified: Secondary | ICD-10-CM | POA: Insufficient documentation

## 2011-03-09 DIAGNOSIS — R109 Unspecified abdominal pain: Secondary | ICD-10-CM | POA: Insufficient documentation

## 2011-03-09 DIAGNOSIS — J690 Pneumonitis due to inhalation of food and vomit: Secondary | ICD-10-CM | POA: Insufficient documentation

## 2011-03-09 DIAGNOSIS — K3184 Gastroparesis: Principal | ICD-10-CM | POA: Insufficient documentation

## 2011-03-09 LAB — HEPATIC FUNCTION PANEL
ALT: 22 U/L (ref 0–53)
Alkaline Phosphatase: 267 U/L — ABNORMAL HIGH (ref 39–117)
Bilirubin, Direct: 0.2 mg/dL (ref 0.0–0.3)
Indirect Bilirubin: 0.3 mg/dL (ref 0.3–0.9)
Total Protein: 7 g/dL (ref 6.0–8.3)

## 2011-03-09 LAB — LIPASE, BLOOD: Lipase: 53 U/L (ref 11–59)

## 2011-03-09 LAB — POCT I-STAT, CHEM 8
BUN: 6 mg/dL (ref 6–23)
Calcium, Ion: 1.06 mmol/L — ABNORMAL LOW (ref 1.12–1.32)
Glucose, Bld: 128 mg/dL — ABNORMAL HIGH (ref 70–99)
TCO2: 24 mmol/L (ref 0–100)

## 2011-03-10 LAB — LIPID PANEL
HDL: 32 mg/dL — ABNORMAL LOW (ref >39)
LDL Cholesterol: 60 mg/dL (ref 0–99)

## 2011-03-11 ENCOUNTER — Inpatient Hospital Stay (HOSPITAL_COMMUNITY): Payer: 59

## 2011-03-11 LAB — COMPREHENSIVE METABOLIC PANEL
AST: 9 U/L (ref 0–37)
BUN: 4 mg/dL — ABNORMAL LOW (ref 6–23)
CO2: 27 mEq/L (ref 19–32)
Calcium: 8 mg/dL — ABNORMAL LOW (ref 8.4–10.5)
Chloride: 98 mEq/L (ref 96–112)
Creatinine, Ser: 0.51 mg/dL (ref 0.50–1.35)
GFR calc Af Amer: >60 mL/min (ref >60)
GFR calc non Af Amer: >60 mL/min (ref >60)
Glucose, Bld: 71 mg/dL (ref 70–99)
Total Bilirubin: 0.5 mg/dL (ref 0.3–1.2)

## 2011-03-11 LAB — DIFFERENTIAL
Eosinophils Absolute: 0.1 10*3/uL (ref 0.0–0.7)
Lymphocytes Relative: 12 % (ref 12–46)
Lymphs Abs: 0.6 10*3/uL — ABNORMAL LOW (ref 0.7–4.0)
Monocytes Relative: 14 % — ABNORMAL HIGH (ref 3–12)
Neutro Abs: 3.4 10*3/uL (ref 1.7–7.7)
Neutrophils Relative %: 71 % (ref 43–77)

## 2011-03-11 LAB — CBC
HCT: 35.9 % — ABNORMAL LOW (ref 39.0–52.0)
Hemoglobin: 11.8 g/dL — ABNORMAL LOW (ref 13.0–17.0)
MCH: 26.9 pg (ref 26.0–34.0)
MCV: 81.8 fL (ref 78.0–100.0)
Platelets: 225 10*3/uL (ref 150–400)
RBC: 4.39 MIL/uL (ref 4.22–5.81)
WBC: 4.7 10*3/uL (ref 4.0–10.5)

## 2011-03-11 LAB — PHOSPHORUS: Phosphorus: 2.9 mg/dL (ref 2.3–4.6)

## 2011-03-12 NOTE — H&P (Signed)
Logan Arnold, Logan Arnold NO.:  [id number]  MEDICAL RECORD NO.:  [id number]  LOCATION:  [facility]                         FACILITY:  [facility]  PHYSICIAN:  Letha Cape, MD         DATE OF BIRTH:  [birth year]  DATE OF ADMISSION:  03/09/2011 DATE OF DISCHARGE:                             HISTORY & PHYSICAL   HISTORY OF PRESENT ILLNESS:  A 45 year old male with a 48-month history of severe gastroparesis following cholecystectomy, resulting in recurrent nausea and vomiting.  This morning, the patient was vomiting and resulted in an jaw dislocation.  The patient presented to the emergency room secondary to this.  While in the ER, the emergency room physician was able to put his jaw back in place.  They have been attempted to have him drink some liquids.  The patient was unable to tolerate this and began vomiting again.  The patient's wife reports that this all started about 2 months ago following cholecystectomy.  Afterthat, he developed severe gastroparesis and has had recurrent episodes of this.  He has lost about 30 pounds in the last few months.  He is followed by Gastroenterology and is attempting to get in to see a specialist at [facility].  PAST MEDICAL HISTORY:  Gastroparesis, hyperlipidemia.  ALLERGIES:  No known drug allergies.  MEDICATIONS: 1. Zofran 4 mg p.o. q.6 h p.r.n. 2. Protonix 40 mg p.o. daily. 3. Reglan 5 mg p.o. daily. 4. Lipitor 10 mg p.o. daily.  PAST SURGICAL HISTORY:  Cholecystectomy.  FAMILY HISTORY:  Noncontributory.  SOCIAL HISTORY:  He is married and lives at home with his wife.  Drinks occasionally, but has not since March.  No tobacco or other drug use. He is a prior Nurse, adult.  PHYSICAL EXAMINATION:  VITAL SIGNS:  Blood pressure 145/84, heart rate 92, respirations 14, oxygen saturation 100% on room air. GENERAL:  This is a well-appearing male, in no acute distress. HEENT:  Pupils equal, round, and reactive to light.  Bandage is  wrapped around his head, keeping his jaw in place. CARDIOVASCULAR:  Regular rate and rhythm, no murmurs, rubs, or gallops. LUNGS:  Clear to auscultation bilaterally. ABDOMEN:  Soft, nondistended, positive bowel sounds, tenderness to palpation in the left upper quadrant. EXTREMITIES:  No cyanosis, clubbing, or edema. NEURO:  Grossly intact.  LABORATORY DATA:  Hemoglobin 16.3, hematocrit 48.  Sodium 135, potassium 3.9, chloride 99, glucose 128, BUN 6, creatinine 0.8.  Ionized calcium 1.06.  RADIOLOGY:  X-ray was shows TMJ dislocation.  Follow up x-ray shows correction of this.  ASSESSMENT:  A 45 year old gentleman with severe gastroparesis resulting in jaw dislocation, now with continued nausea and vomiting. 1. Nausea and vomiting.  We will treat with antiemetics as needed.     The patient needs to stay on a liquid diet given the jaw     dislocation at this time. 2. Hyperlipidemia.  Check a fasting lipid panel.  The patient likely     does not need his home Lipitor, now that he has lost 30 pounds     secondary to gastroparesis.3. Gastroparesis.  Continue antiemetics as needed.  The patient needs     some further workup, which can be continued as  an outpatient. 4. Disposition.  Patient is stable for discharge once able to tolerate     p.o. without vomiting.          ______________________________ Letha Cape, MD     RW/MEDQ  D:  03/09/2011  T:  03/09/2011  Job:  [id number]  Electronically Signed by Letha Cape MD on 03/12/2011 12:43:31 PM

## 2011-03-15 NOTE — Discharge Summary (Signed)
[name], [name]:  [id number]  MEDICAL RECORD NO.:  [id number]  LOCATION:  5503                         FACILITY:  [facility]  PHYSICIAN:  Thad Ranger, MD       DATE OF BIRTH:  Feb 27, 1966  DATE OF ADMISSION:  03/09/2011 DATE OF DISCHARGE:  03/11/2011                        DISCHARGE SUMMARY - REFERRING   PRIMARY CARE PHYSICIAN:  Tally Joe, MD  DISCHARGE DIAGNOSES: 1. Nausea and vomiting secondary to gastroparesis, resolved. 2. Hyperlipidemia. 3. Pain secondary to gastroparesis. 4. Aspiration pneumonia. 5. Jaw dislocation secondary to nausea, vomiting, and severe     gastroparesis.  DISCHARGE MEDICATIONS: 1. Augmentin 500 mg p.o. b.i.d. for 7 days. 2. Vicodin 5/325 mg 1 tablet p.o. every 8 hours as needed for pain. 3. Phenergan 12.5 mg p.o. every 6 hours as needed for nausea/vomiting. 4. Tramadol 50 mg 1-2 tablets p.o. every 8 hours as needed for pain. 5. Reglan 5 mg p.o. t.i.d. before meals. 6. Pantoprazole 40 mg p.o. daily. 7. Zofran 4 mg p.o. every 6 hours as needed for nausea.  BRIEF HISTORY OF PRESENT ILLNESS:  [name] is a 45 year old male with 48-month history of severe gastroparesis following cholecystectomy resulting in recurrent nausea and vomiting.  On the day of admission, on March 09, 2011, the patient was vomiting and resulted in jaw dislocation. The patient presented in the emergency room secondary to this.  While in the emergency room, the ER physician was able to put his jaw back in place.  The patient was unable to tolerate liquids at the time of admission.  The patient's wife reported that this all started about 2 months prior to admission following cholecystectomy, after that he developed severe gastroparesis and had recurrent episodes of this, he lost about 30 pounds in the last few months.  He is followed by Gastroenterology and attempting to see specialist at [facility].  RADIOLOGICAL DATA:  Orthopantogram on March 09, 2011,  anteroinferior dislocation of the mandibular condyles bilaterally with respect to the temporomandibular joint, no fracture line is seen.  Orthopantogram on March 09, 2011, after the reduction of mandible dislocation, the mandibular condyles are now both normally located in the condylar fossa. Abdominal x-ray on June 18, showed abnormal density at the right lung base which could represent pneumonia, benign-appearing abdomen.  PERTINENT LAB/DIAGNOSTIC DATA:  Lipase 53.  Lipid profile, cholesterol 105, HDL 32, LDL 60.  BRIEF HOSPITALIZATION COURSE:  [name] is a 45 year old male who with severe gastroparesis, presented with jaw dislocation and nausea/vomiting. 1. Jaw dislocation secondary to nausea and vomiting.  This was placed     back by the ER physician with before and after orthopantograms.     The patient is currently tolerating a regular diet without any     difficulty. 2. Hyperlipidemia.  Lipid panel was checked.  The patient was on     Lipitor, however, he has lost 30 pounds secondary to gastroparesis.     The patient's cholesterol level and LDL are within normal range.     Lipitor can be discontinued at this time. 3. Gastroparesis.  The patient was continued on antiemetics and pain     control.  He will continue to follow his primary care  physician and     Gastroenterology, Dr. Jeani Hawking, outpatient and [facility]. 4. Aspiration pneumonia, likely secondary to nausea and vomiting.  The     patient is not having any leukocytosis or fevers or chills.     However, I will complete the course of antibiotics for aspiration     pneumonitis.  PHYSICAL EXAMINATION:  VITAL SIGNS:  At the time of discharge, temperature 98.0, pulse 100, respirations 18, blood pressure 127/86, and O2 sats 94%. GENERAL:  The patient is alert, awake, and oriented x3.  No acute distress. HEENT:  Anicteric sclerae.  Pink conjunctivae.  Pupils reactive to light and accommodation.   EOMI. NECK:  Supple.  No lymphopathy.  No JVD. CVS:  S1 and S2 clear.  Regular rate and rhythm. CHEST:  Clear to auscultation bilaterally. ABDOMEN:  Soft, nontender, nondistended.  Normal bowel sounds. EXTREMITIES:  No cyanosis, clubbing, or edema noted in upper and lower extremities bilaterally.  Discharge follow up with Dr. Tally Joe in next 7-10 days and Gastroenterology within next 7-10 days.  Discharge time, 35 minutes.     Thad Ranger, MD     RR/MEDQ  D:  03/11/2011  T:  03/11/2011  Job:  [id number]  cc:   Tally Joe, M.D. Jordan Hawks Elnoria Howard, MD  Electronically Signed by Andres Labrum Elieser Tetrick  on 03/15/2011 05:08:05 PM

## 2011-04-21 ENCOUNTER — Emergency Department (HOSPITAL_COMMUNITY): Payer: 59

## 2011-04-21 ENCOUNTER — Emergency Department (HOSPITAL_COMMUNITY)
Admission: EM | Admit: 2011-04-21 | Discharge: 2011-04-21 | Disposition: A | Discharge status: Home / Self Care | Payer: 59 | Attending: Emergency Medicine | Admitting: Emergency Medicine

## 2011-04-21 DIAGNOSIS — K3184 Gastroparesis: Secondary | ICD-10-CM | POA: Insufficient documentation

## 2011-04-21 DIAGNOSIS — E7800 Pure hypercholesterolemia, unspecified: Secondary | ICD-10-CM | POA: Insufficient documentation

## 2011-04-21 DIAGNOSIS — R112 Nausea with vomiting, unspecified: Secondary | ICD-10-CM | POA: Insufficient documentation

## 2011-04-21 DIAGNOSIS — R197 Diarrhea, unspecified: Secondary | ICD-10-CM | POA: Insufficient documentation

## 2011-04-21 DIAGNOSIS — Z9089 Acquired absence of other organs: Secondary | ICD-10-CM | POA: Insufficient documentation

## 2011-04-21 DIAGNOSIS — R109 Unspecified abdominal pain: Secondary | ICD-10-CM | POA: Insufficient documentation

## 2011-04-21 LAB — URINALYSIS, ROUTINE W REFLEX MICROSCOPIC
Ketones, ur: 40 mg/dL — AB
Nitrite: NEGATIVE
Protein, ur: NEGATIVE mg/dL
pH: 6.5 (ref 5.0–8.0)

## 2011-04-21 LAB — CBC
HCT: 39.1 % (ref 39.0–52.0)
MCHC: 32.7 g/dL (ref 30.0–36.0)
MCV: 82 fL (ref 78.0–100.0)
Platelets: 259 10*3/uL (ref 150–400)
Platelets: 273 10*3/uL (ref 150–400)
RBC: 4.88 MIL/uL (ref 4.22–5.81)
RDW: 15 % (ref 11.5–15.5)
RDW: 15.2 % (ref 11.5–15.5)
WBC: 6.8 10*3/uL (ref 4.0–10.5)

## 2011-04-21 LAB — POCT I-STAT, CHEM 8
BUN: 8 mg/dL (ref 6–23)
Chloride: 102 mEq/L (ref 96–112)
Potassium: 4.2 mEq/L (ref 3.5–5.1)
Sodium: 134 mEq/L — ABNORMAL LOW (ref 135–145)
TCO2: 21 mmol/L (ref 0–100)

## 2011-04-21 LAB — DIFFERENTIAL
Basophils Relative: 0 % (ref 0–1)
Eosinophils Absolute: 0 10*3/uL (ref 0.0–0.7)
Eosinophils Relative: 0 % (ref 0–5)
Lymphocytes Relative: 10 % — ABNORMAL LOW (ref 12–46)
Lymphocytes Relative: 7 % — ABNORMAL LOW (ref 12–46)
Lymphs Abs: 0.5 10*3/uL — ABNORMAL LOW (ref 0.7–4.0)
Lymphs Abs: 0.5 10*3/uL — ABNORMAL LOW (ref 0.7–4.0)
Monocytes Absolute: 0.6 10*3/uL (ref 0.1–1.0)
Monocytes Relative: 8 % (ref 3–12)
Neutro Abs: 5.7 10*3/uL (ref 1.7–7.7)
Neutrophils Relative %: 85 % — ABNORMAL HIGH (ref 43–77)

## 2011-04-21 LAB — COMPREHENSIVE METABOLIC PANEL
ALT: 24 U/L (ref 0–53)
AST: 15 U/L (ref 0–37)
Albumin: 3.7 g/dL (ref 3.5–5.2)
Albumin: 3.6 g/dL (ref 3.5–5.2)
Alkaline Phosphatase: 85 U/L (ref 39–117)
BUN: 8 mg/dL (ref 6–23)
Calcium: 9.4 mg/dL (ref 8.4–10.5)
Chloride: 97 mEq/L (ref 96–112)
Creatinine, Ser: 0.68 mg/dL (ref 0.50–1.35)
Potassium: 4.2 mEq/L (ref 3.5–5.1)
Sodium: 137 mEq/L (ref 135–145)
Total Bilirubin: 0.6 mg/dL (ref 0.3–1.2)
Total Protein: 6.3 g/dL (ref 6.0–8.3)

## 2011-04-21 LAB — LIPASE, BLOOD: Lipase: 18 U/L (ref 11–59)

## 2011-04-21 MED ORDER — IOHEXOL 300 MG/ML  SOLN: 100.0000 mL | Freq: Once | INTRAMUSCULAR | Status: DC | PRN

## 2011-05-04 ENCOUNTER — Emergency Department (HOSPITAL_COMMUNITY): Payer: 59

## 2011-05-04 ENCOUNTER — Emergency Department (HOSPITAL_COMMUNITY)
Admission: EM | Admit: 2011-05-04 | Discharge: 2011-05-04 | Disposition: A | Discharge status: Home / Self Care | Payer: 59 | Attending: Emergency Medicine | Admitting: Emergency Medicine

## 2011-05-04 DIAGNOSIS — Z79899 Other long term (current) drug therapy: Secondary | ICD-10-CM | POA: Insufficient documentation

## 2011-05-04 DIAGNOSIS — R112 Nausea with vomiting, unspecified: Secondary | ICD-10-CM | POA: Insufficient documentation

## 2011-05-04 DIAGNOSIS — R109 Unspecified abdominal pain: Secondary | ICD-10-CM | POA: Insufficient documentation

## 2011-05-04 DIAGNOSIS — R10819 Abdominal tenderness, unspecified site: Secondary | ICD-10-CM | POA: Insufficient documentation

## 2011-05-04 DIAGNOSIS — E789 Disorder of lipoprotein metabolism, unspecified: Secondary | ICD-10-CM | POA: Insufficient documentation

## 2011-05-04 LAB — CBC
Hemoglobin: 13.1 g/dL (ref 13.0–17.0)
MCH: 27.4 pg (ref 26.0–34.0)
MCHC: 33 g/dL (ref 30.0–36.0)

## 2011-05-04 LAB — URINALYSIS, ROUTINE W REFLEX MICROSCOPIC
Bilirubin Urine: NEGATIVE
Leukocytes, UA: NEGATIVE
Nitrite: NEGATIVE
Specific Gravity, Urine: 1.02 (ref 1.005–1.030)
pH: 7.5 (ref 5.0–8.0)

## 2011-05-04 LAB — DIFFERENTIAL
Basophils Relative: 1 % (ref 0–1)
Eosinophils Absolute: 0 10*3/uL (ref 0.0–0.7)
Eosinophils Relative: 0 % (ref 0–5)
Monocytes Absolute: 0.5 10*3/uL (ref 0.1–1.0)
Monocytes Relative: 9 % (ref 3–12)
Neutro Abs: 5 10*3/uL (ref 1.7–7.7)

## 2011-05-04 LAB — COMPREHENSIVE METABOLIC PANEL
ALT: 11 U/L (ref 0–53)
Calcium: 9.4 mg/dL (ref 8.4–10.5)
GFR calc Af Amer: >60 mL/min (ref >60)
Glucose, Bld: 101 mg/dL — ABNORMAL HIGH (ref 70–99)
Sodium: 138 mEq/L (ref 135–145)
Total Protein: 6.6 g/dL (ref 6.0–8.3)

## 2011-07-26 ENCOUNTER — Encounter: Payer: Self-pay | Admitting: *Deleted

## 2011-07-26 ENCOUNTER — Emergency Department (HOSPITAL_BASED_OUTPATIENT_CLINIC_OR_DEPARTMENT_OTHER)
Admission: EM | Admit: 2011-07-26 | Discharge: 2011-07-26 | Disposition: A | Discharge status: Home / Self Care | Payer: Worker's Compensation | Attending: Emergency Medicine | Admitting: Emergency Medicine

## 2011-07-26 ENCOUNTER — Emergency Department (INDEPENDENT_AMBULATORY_CARE_PROVIDER_SITE_OTHER): Payer: Worker's Compensation

## 2011-07-26 DIAGNOSIS — Y92410 Unspecified street and highway as the place of occurrence of the external cause: Secondary | ICD-10-CM | POA: Insufficient documentation

## 2011-07-26 DIAGNOSIS — R0602 Shortness of breath: Secondary | ICD-10-CM

## 2011-07-26 DIAGNOSIS — M25539 Pain in unspecified wrist: Secondary | ICD-10-CM | POA: Insufficient documentation

## 2011-07-26 MED ORDER — IBUPROFEN 800 MG PO TABS
800.0000 mg | ORAL_TABLET | Freq: Once | ORAL | Status: AC
  Administered 2011-07-26: 800 mg via ORAL
  Filled 2011-07-26: qty 1
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
## 2011-08-05 ENCOUNTER — Emergency Department (HOSPITAL_BASED_OUTPATIENT_CLINIC_OR_DEPARTMENT_OTHER)
Admission: EM | Admit: 2011-08-05 | Discharge: 2011-08-05 | Disposition: A | Discharge status: Home / Self Care | Payer: Worker's Compensation | Attending: Emergency Medicine | Admitting: Emergency Medicine

## 2011-08-05 ENCOUNTER — Emergency Department (INDEPENDENT_AMBULATORY_CARE_PROVIDER_SITE_OTHER): Payer: Worker's Compensation

## 2011-08-05 DIAGNOSIS — M25519 Pain in unspecified shoulder: Secondary | ICD-10-CM | POA: Insufficient documentation

## 2011-08-05 DIAGNOSIS — M25512 Pain in left shoulder: Secondary | ICD-10-CM

## 2011-08-05 DIAGNOSIS — Y92410 Unspecified street and highway as the place of occurrence of the external cause: Secondary | ICD-10-CM | POA: Insufficient documentation

## 2011-08-05 MED ORDER — IBUPROFEN 800 MG PO TABS: 800.0000 mg | ORAL_TABLET | Freq: Three times a day (TID) | ORAL | Status: AC
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
## 2011-08-07 NOTE — ED Provider Notes (Signed)
Evaluation and management procedures were performed by the mid-level provider (PA/NP/CNM) under my supervision/collaboration. I was present and available during the ED course. Denell Cothern Y.   Ariann Khaimov Y. Darious Rehman, MD 08/07/11 0823 

## 2011-08-09 ENCOUNTER — Encounter (HOSPITAL_BASED_OUTPATIENT_CLINIC_OR_DEPARTMENT_OTHER): Payer: Self-pay | Admitting: *Deleted

## 2012-03-23 HISTORY — PX: LAPAROSCOPIC HELLER MYOTOMY: SUR780

## 2012-04-02 IMAGING — CT CT ABD-PELV W/O CM
2 of 4 series · 17 of 46 positions shown, 19 images · non-contrast
Comparison: 01/22/2011

CLINICAL DATA: Abdominal pain with vomiting.  Evaluate for kidney
stones.  Recent cholecystectomy.

CT ABDOMEN AND PELVIS WITHOUT CONTRAST
TECHNIQUE: Multidetector CT imaging of the abdomen and pelvis was
performed following the standard protocol without intravenous
contrast.

[Series 2: renal stone w/o · axial · non-contrast · 0.70mm/px · z∈[-318,+62]mm · 14 of 84 slices shown, 16 images]
[im 4/84  soft-tissue]
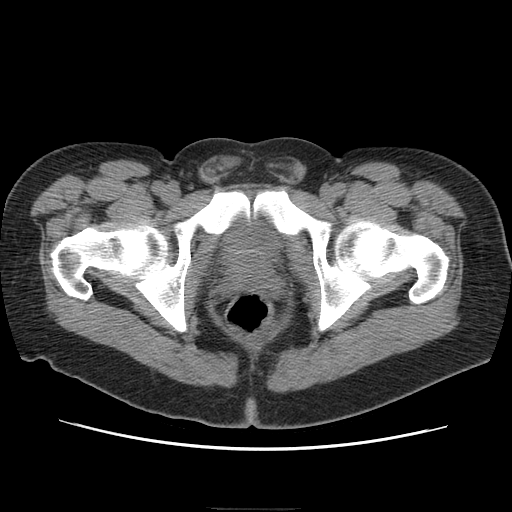
[im 4/84  bone]
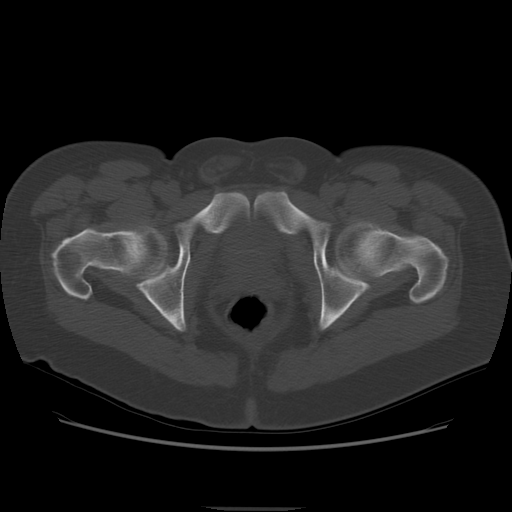
[im 11/84  soft-tissue]
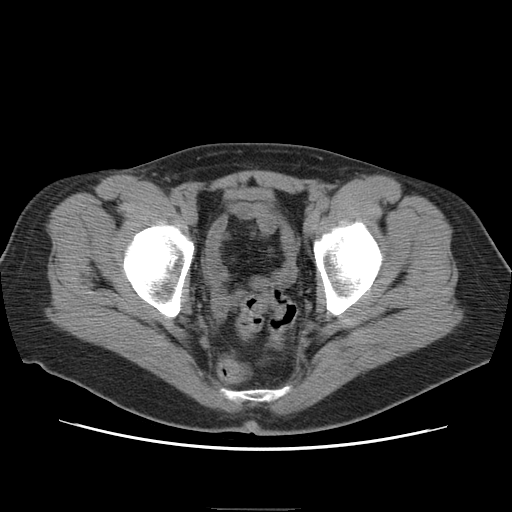
[im 18/84  soft-tissue]
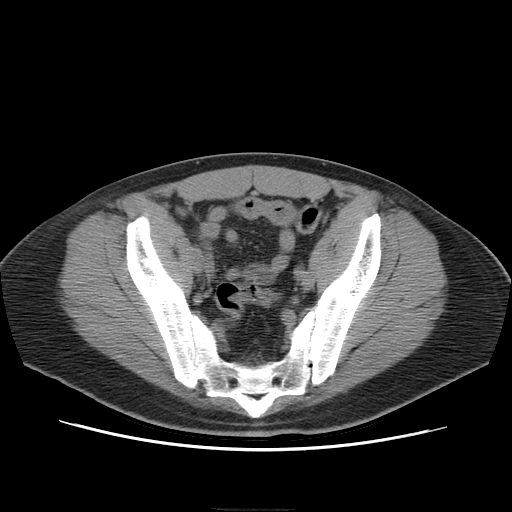
[im 21/84  soft-tissue]
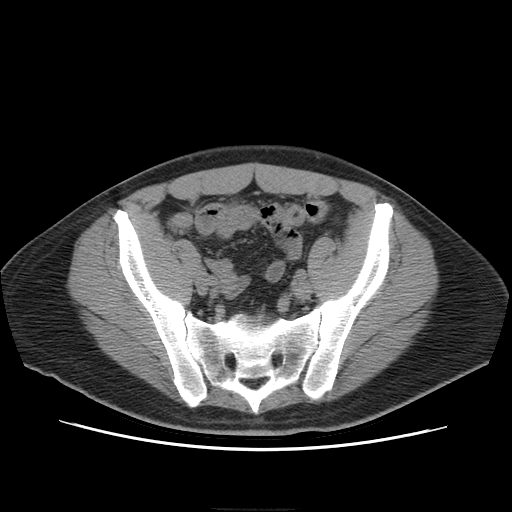
[im 28/84  soft-tissue]
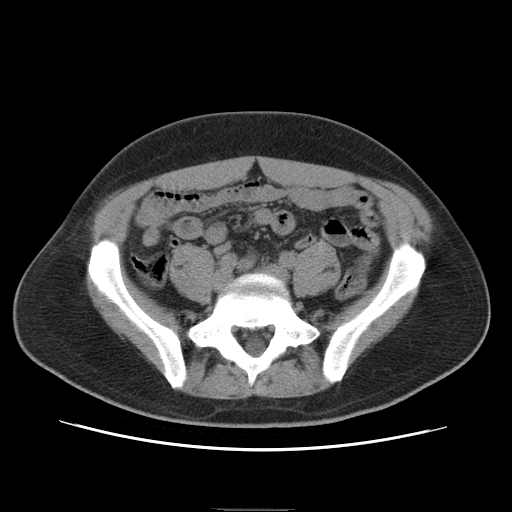
[im 35/84  soft-tissue]
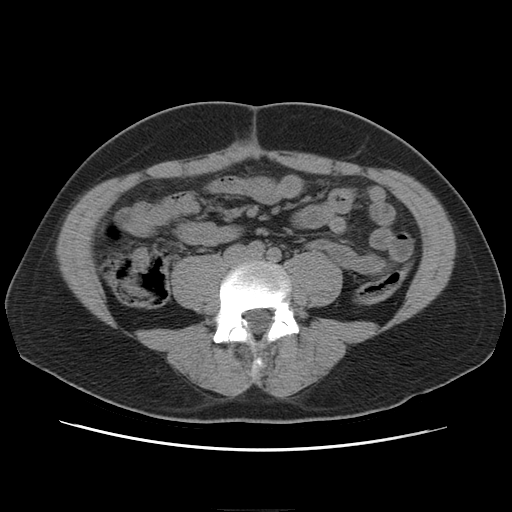
[im 39/84  soft-tissue]
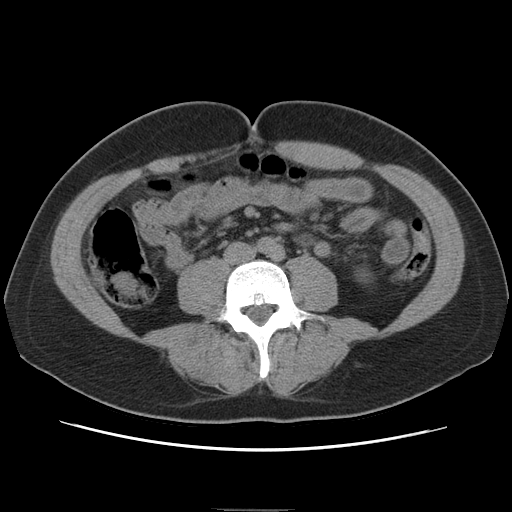
[im 45/84  soft-tissue]
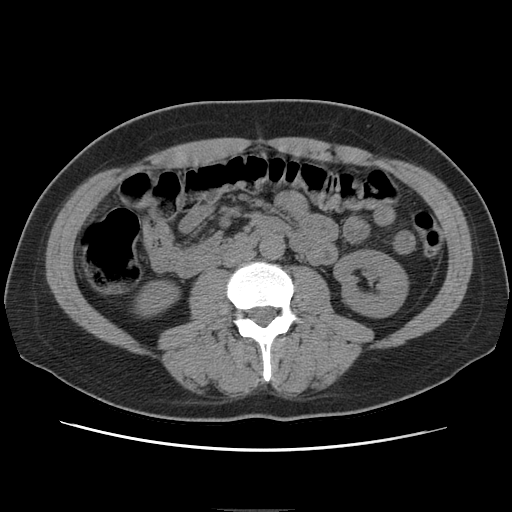
[im 49/84  soft-tissue]
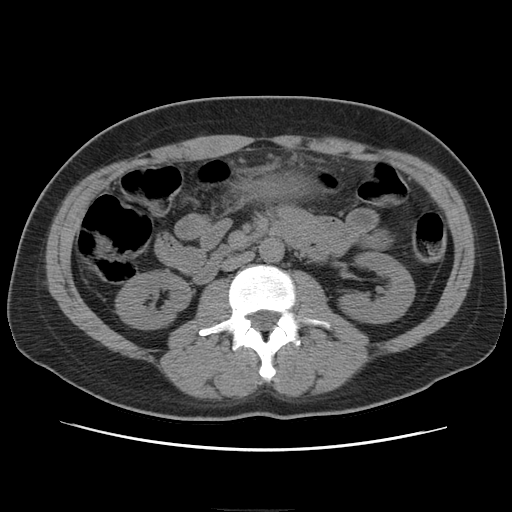
[im 49/84  bone]
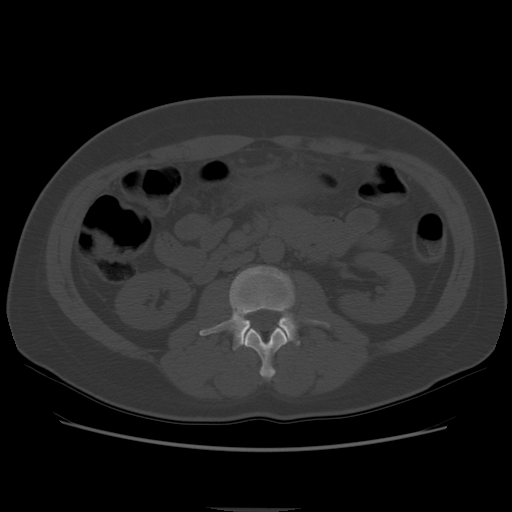
[im 56/84  soft-tissue]
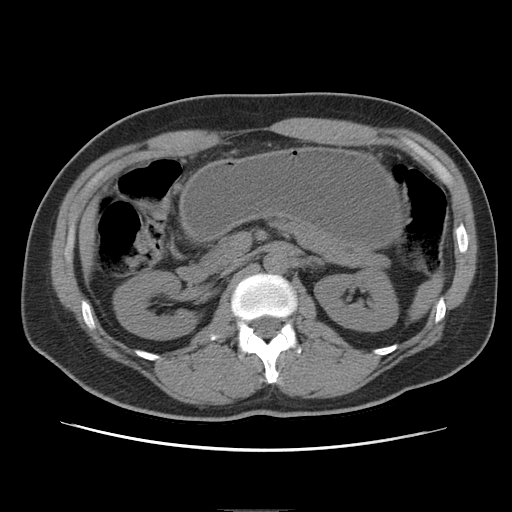
[im 63/84  soft-tissue]
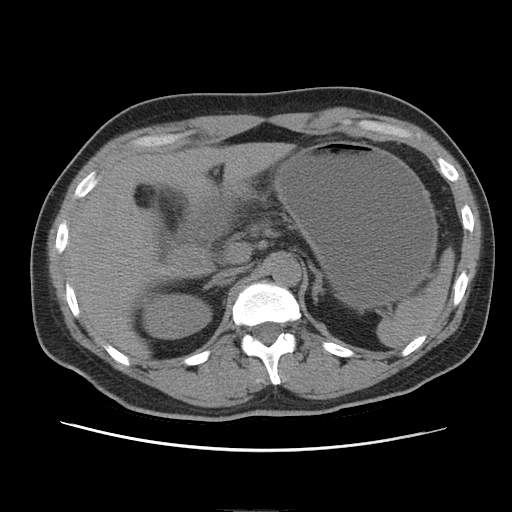
[im 66/84  soft-tissue]
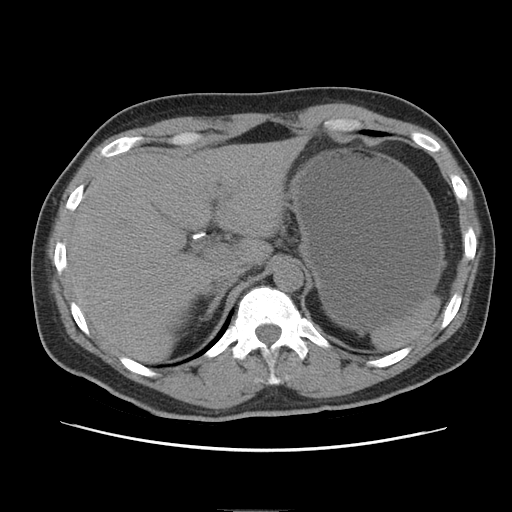
[im 73/84  soft-tissue]
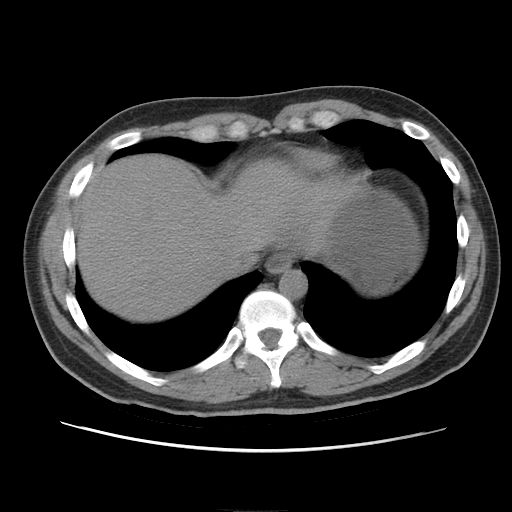
[im 80/84  soft-tissue]
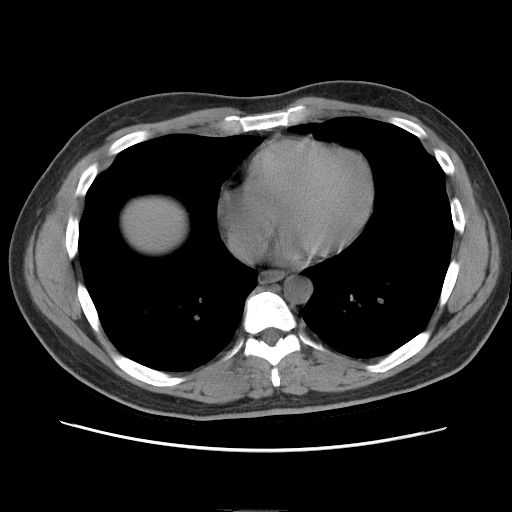

[Series 400: coronal · coronal · 0.94mm/px · 3 of 104 slices shown]
[im 35/104  soft-tissue]
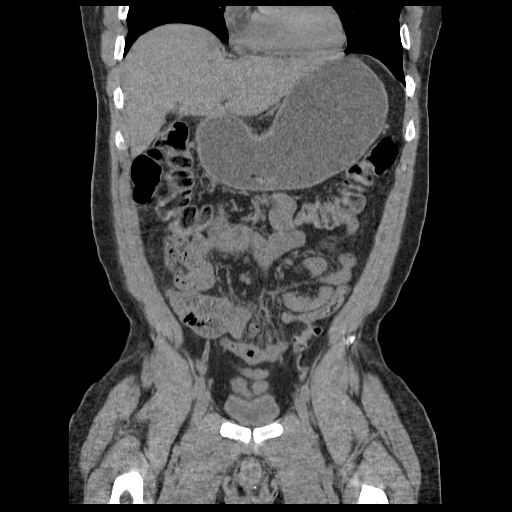
[im 46/104  soft-tissue]
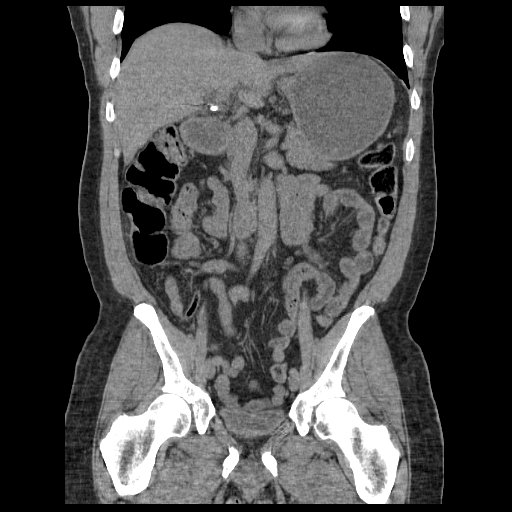
[im 58/104  soft-tissue]
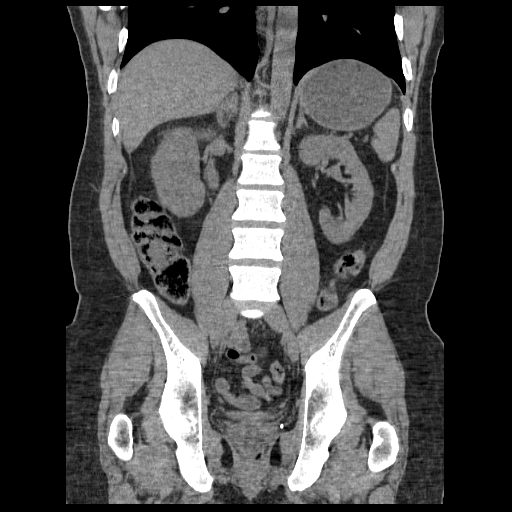

[17 of 46 positions shown; findings below may reference images not displayed]

FINDINGS: The lung bases appear clear.

Cholecystectomy clips are identified in the right upper quadrant.
No signs of intrahepatic ductal dilatation are suggested.  No renal
ureteral or vesicular calcification is identified.

The pancreas, adrenal glands, spleen, kidneys and liver have an
unremarkable non-contrasted appearance.

The stomach is fully distended.  No focal bowel abnormality is
identified.  The ileocecal valve and appendix have a normal
appearance.  No signs of obstruction are identified.  No pathologic
adenopathy is seen although there has been an increase in shotty
mesenteric adenopathy since the previous exam and this may indicate
some nonspecific adenitis.  No intra-abdominal or pelvic fluid is
noted and no inflammatory changes identified.

Extraabdominal soft tissue planes are within normal limits and no
worrisome focal bony lesions are seen.

The bladder, prostate and pelvic structures are unremarkable.
IMPRESSION: No evidence for urinary tract calculi, focal renal abnormality or
hydronephrosis.

Slight increase in nonpathologic, shotty mesenteric adenopathy
since the previous exam may indicate some underlying nonspecific
adenitis.  No other focal abnormality identified to explain the
patient's right sided abdominal pain.

## 2012-04-23 HISTORY — PX: EXPLORATORY LAPAROTOMY: SUR591
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
## 2012-10-09 ENCOUNTER — Other Ambulatory Visit: Payer: Self-pay | Admitting: Urology

## 2012-11-04 ENCOUNTER — Encounter (HOSPITAL_BASED_OUTPATIENT_CLINIC_OR_DEPARTMENT_OTHER): Payer: Self-pay | Admitting: *Deleted

## 2012-11-04 NOTE — Progress Notes (Signed)
NPO AFTER MN. ARRIVES AT 0600. NEEDS ISTAT 8 . PT TO CALL BACK W/ MED LIST. REVIEWED [facility] GUIDELINES.

## 2012-11-05 ENCOUNTER — Encounter (HOSPITAL_BASED_OUTPATIENT_CLINIC_OR_DEPARTMENT_OTHER): Payer: Self-pay | Admitting: *Deleted

## 2012-11-05 NOTE — Progress Notes (Signed)
Pt called with remaining list of medications-instructed to take metoprolol with small amt water that morning-reminded Npo after Mn-to arrive at 0600.

## 2012-11-09 ENCOUNTER — Ambulatory Visit (HOSPITAL_BASED_OUTPATIENT_CLINIC_OR_DEPARTMENT_OTHER)
Admission: RE | Admit: 2012-11-09 | Discharge: 2012-11-10 | Disposition: A | Discharge status: Home / Self Care | Payer: 59 | Source: Ambulatory Visit | Attending: Urology | Admitting: Urology

## 2012-11-09 ENCOUNTER — Encounter (HOSPITAL_BASED_OUTPATIENT_CLINIC_OR_DEPARTMENT_OTHER): Payer: Self-pay | Admitting: *Deleted

## 2012-11-09 ENCOUNTER — Ambulatory Visit (HOSPITAL_BASED_OUTPATIENT_CLINIC_OR_DEPARTMENT_OTHER): Payer: 59 | Admitting: Anesthesiology

## 2012-11-09 ENCOUNTER — Encounter (HOSPITAL_BASED_OUTPATIENT_CLINIC_OR_DEPARTMENT_OTHER)
Admission: RE | Disposition: A | Discharge status: Home / Self Care | Payer: Self-pay | Source: Ambulatory Visit | Attending: Urology

## 2012-11-09 ENCOUNTER — Encounter (HOSPITAL_BASED_OUTPATIENT_CLINIC_OR_DEPARTMENT_OTHER): Payer: Self-pay | Admitting: Anesthesiology

## 2012-11-09 DIAGNOSIS — N529 Male erectile dysfunction, unspecified: Secondary | ICD-10-CM | POA: Insufficient documentation

## 2012-11-09 DIAGNOSIS — I10 Essential (primary) hypertension: Secondary | ICD-10-CM | POA: Insufficient documentation

## 2012-11-09 DIAGNOSIS — Z79899 Other long term (current) drug therapy: Secondary | ICD-10-CM | POA: Insufficient documentation

## 2012-11-09 HISTORY — PX: PENILE PROSTHESIS IMPLANT: SHX240

## 2012-11-09 HISTORY — DX: Personal history of other diseases of the circulatory system: Z86.79

## 2012-11-09 HISTORY — DX: Male erectile dysfunction, unspecified: N52.9

## 2012-11-09 LAB — POCT I-STAT, CHEM 8
Chloride: 104 mEq/L (ref 96–112)
Glucose, Bld: 85 mg/dL (ref 70–99)
HCT: 49 % (ref 39.0–52.0)
Potassium: 4.7 mEq/L (ref 3.5–5.1)

## 2012-11-09 SURGERY — INSERTION, PENILE PROSTHESIS, INFLATABLE
Anesthesia: General | Site: Penis | Wound class: Clean Contaminated

## 2012-11-09 MED ORDER — 0.9 % SODIUM CHLORIDE (POUR BTL) OPTIME
TOPICAL | Status: DC | PRN
  Administered 2012-11-09: 1000 mL

## 2012-11-09 MED ORDER — ACETAMINOPHEN 10 MG/ML IV SOLN
INTRAVENOUS | Status: DC | PRN
  Administered 2012-11-09: 1000 mg via INTRAVENOUS

## 2012-11-09 MED ORDER — ACETAMINOPHEN 10 MG/ML IV SOLN
1000.0000 mg | Freq: Four times a day (QID) | INTRAVENOUS | Status: DC
  Administered 2012-11-09: 1000 mg via INTRAVENOUS
  Filled 2012-11-09: qty 100

## 2012-11-09 MED ORDER — SODIUM CHLORIDE 0.9 % IR SOLN
Status: DC | PRN
  Administered 2012-11-09: 08:00:00

## 2012-11-09 MED ORDER — OXYCODONE HCL 5 MG PO TABS
5.0000 mg | ORAL_TABLET | Freq: Once | ORAL | Status: AC | PRN
  Administered 2012-11-09: 5 mg via ORAL
  Filled 2012-11-09: qty 1

## 2012-11-09 MED ORDER — MIDAZOLAM HCL 5 MG/5ML IJ SOLN
INTRAMUSCULAR | Status: DC | PRN
  Administered 2012-11-09: 2 mg via INTRAVENOUS

## 2012-11-09 MED ORDER — LIDOCAINE HCL (CARDIAC) 20 MG/ML IV SOLN
INTRAVENOUS | Status: DC | PRN
  Administered 2012-11-09: 80 mg via INTRAVENOUS

## 2012-11-09 MED ORDER — METOPROLOL TARTRATE 25 MG PO TABS
25.0000 mg | ORAL_TABLET | Freq: Two times a day (BID) | ORAL | Status: DC
  Filled 2012-11-09: qty 1

## 2012-11-09 MED ORDER — GENTAMICIN SULFATE 40 MG/ML IJ SOLN
340.0000 mg | Freq: Once | INTRAVENOUS | Status: DC
  Administered 2012-11-09: 340 mg via INTRAVENOUS
  Filled 2012-11-09 (×2): qty 8.5

## 2012-11-09 MED ORDER — FENTANYL CITRATE 0.05 MG/ML IJ SOLN
INTRAMUSCULAR | Status: DC | PRN
  Administered 2012-11-09 (×4): 50 ug via INTRAVENOUS

## 2012-11-09 MED ORDER — ONDANSETRON HCL 4 MG/2ML IJ SOLN
INTRAMUSCULAR | Status: DC | PRN
  Administered 2012-11-09: 4 mg via INTRAVENOUS

## 2012-11-09 MED ORDER — HYDROMORPHONE HCL PF 1 MG/ML IJ SOLN
0.2500 mg | INTRAMUSCULAR | Status: DC | PRN
  Administered 2012-11-09 (×2): 0.5 mg via INTRAVENOUS
  Filled 2012-11-09: qty 1

## 2012-11-09 MED ORDER — CEFAZOLIN SODIUM-DEXTROSE 2-3 GM-% IV SOLR
2.0000 g | INTRAVENOUS | Status: AC
  Administered 2012-11-09: 2 g via INTRAVENOUS
  Filled 2012-11-09: qty 50

## 2012-11-09 MED ORDER — MEPERIDINE HCL 25 MG/ML IJ SOLN
6.2500 mg | INTRAMUSCULAR | Status: DC | PRN
  Filled 2012-11-09: qty 1

## 2012-11-09 MED ORDER — HYDROCODONE-ACETAMINOPHEN 5-325 MG PO TABS
1.0000 | ORAL_TABLET | Freq: Four times a day (QID) | ORAL | Status: DC | PRN
  Administered 2012-11-09 – 2012-11-10 (×3): 2 via ORAL
  Filled 2012-11-09: qty 2

## 2012-11-09 MED ORDER — LACTATED RINGERS IV SOLN
INTRAVENOUS | Status: DC
Start: 2012-11-09 — End: 2012-11-09
  Administered 2012-11-09 (×2): via INTRAVENOUS
  Filled 2012-11-09: qty 1000

## 2012-11-09 MED ORDER — DEXTROSE-NACL 5-0.45 % IV SOLN
INTRAVENOUS | Status: DC
  Administered 2012-11-09: 18:00:00 via INTRAVENOUS
  Filled 2012-11-09: qty 1000

## 2012-11-09 MED ORDER — OXYCODONE HCL 5 MG/5ML PO SOLN
5.0000 mg | Freq: Once | ORAL | Status: AC | PRN
  Filled 2012-11-09: qty 5

## 2012-11-09 MED ORDER — PROPOFOL 10 MG/ML IV BOLUS
INTRAVENOUS | Status: DC | PRN
  Administered 2012-11-09: 280 mg via INTRAVENOUS

## 2012-11-09 MED ORDER — DICYCLOMINE HCL 10 MG PO CAPS
10.0000 mg | ORAL_CAPSULE | Freq: Three times a day (TID) | ORAL | Status: DC
  Filled 2012-11-09: qty 1

## 2012-11-09 MED ORDER — CIPROFLOXACIN HCL 500 MG PO TABS: 500.0000 mg | ORAL_TABLET | Freq: Two times a day (BID) | ORAL | Status: DC

## 2012-11-09 MED ORDER — DEXTROSE 5 % IV SOLN
160.0000 mg | INTRAVENOUS | Status: DC
  Filled 2012-11-09: qty 4

## 2012-11-09 MED ORDER — DEXAMETHASONE SODIUM PHOSPHATE 4 MG/ML IJ SOLN
INTRAMUSCULAR | Status: DC | PRN
  Administered 2012-11-09: 10 mg via INTRAVENOUS

## 2012-11-09 MED ORDER — LORAZEPAM 0.5 MG PO TABS
0.5000 mg | ORAL_TABLET | Freq: Every evening | ORAL | Status: DC | PRN
  Filled 2012-11-09: qty 1

## 2012-11-09 MED ORDER — PANTOPRAZOLE SODIUM 40 MG PO TBEC
40.0000 mg | DELAYED_RELEASE_TABLET | Freq: Every day | ORAL | Status: DC
  Filled 2012-11-09: qty 1

## 2012-11-09 MED ORDER — CEFAZOLIN SODIUM 1-5 GM-% IV SOLN
1.0000 g | Freq: Three times a day (TID) | INTRAVENOUS | Status: DC
  Filled 2012-11-09: qty 50

## 2012-11-09 MED ORDER — METHYLCELLULOSE (LAXATIVE) PO POWD: 1.0000 | Freq: Every day | ORAL | Status: DC

## 2012-11-09 MED ORDER — CEFAZOLIN SODIUM-DEXTROSE 2-3 GM-% IV SOLR
2.0000 g | Freq: Three times a day (TID) | INTRAVENOUS | Status: DC
  Administered 2012-11-09 – 2012-11-10 (×3): 2 g via INTRAVENOUS
  Filled 2012-11-09: qty 50

## 2012-11-09 MED ORDER — PROMETHAZINE HCL 25 MG/ML IJ SOLN
6.2500 mg | INTRAMUSCULAR | Status: DC | PRN
  Filled 2012-11-09: qty 1

## 2012-11-09 MED ORDER — EPHEDRINE SULFATE 50 MG/ML IJ SOLN
INTRAMUSCULAR | Status: DC | PRN
  Administered 2012-11-09: 10 mg via INTRAVENOUS

## 2012-11-09 MED ORDER — CEFAZOLIN SODIUM 1-5 GM-% IV SOLN
1.0000 g | INTRAVENOUS | Status: DC
  Filled 2012-11-09: qty 50

## 2012-11-09 MED ORDER — MORPHINE SULFATE 2 MG/ML IJ SOLN
2.0000 mg | INTRAMUSCULAR | Status: DC | PRN
  Administered 2012-11-09: 2 mg via INTRAVENOUS
  Filled 2012-11-09: qty 2

## 2012-11-09 MED ORDER — MIRTAZAPINE 15 MG PO TABS
15.0000 mg | ORAL_TABLET | Freq: Every day | ORAL | Status: DC
Start: 2012-11-09 — End: 2012-11-10
  Filled 2012-11-09: qty 1

## 2012-11-09 MED ORDER — ACETAMINOPHEN 10 MG/ML IV SOLN
1000.0000 mg | Freq: Once | INTRAVENOUS | Status: AC | PRN
  Filled 2012-11-09: qty 100

## 2012-11-09 SURGICAL SUPPLY — 60 items
APPLICATOR COTTON TIP 6IN STRL (MISCELLANEOUS) IMPLANT
Assembly kit (Urological Implant) ×2 IMPLANT
BAG DECANTER FOR FLEXI CONT (MISCELLANEOUS) IMPLANT
BAG URINE DRAINAGE (UROLOGICAL SUPPLIES) ×2 IMPLANT
BANDAGE CO FLEX L/F 2IN X 5YD (GAUZE/BANDAGES/DRESSINGS) IMPLANT
BANDAGE GAUZE ELAST BULKY 4 IN (GAUZE/BANDAGES/DRESSINGS) ×2 IMPLANT
BENZOIN TINCTURE PRP APPL 2/3 (GAUZE/BANDAGES/DRESSINGS) IMPLANT
BLADE HEX COATED 2.75 (ELECTRODE) ×2 IMPLANT
BLADE SURG 15 STRL LF DISP TIS (BLADE) ×2 IMPLANT
BLADE SURG 15 STRL SS (BLADE) ×2
BLADE SURG ROTATE 9660 (MISCELLANEOUS) ×2 IMPLANT
CANISTER SUCTION 1200CC (MISCELLANEOUS) IMPLANT
CANISTER SUCTION 2500CC (MISCELLANEOUS) ×2 IMPLANT
CATH FOLEY 2WAY SLVR  5CC 16FR (CATHETERS) ×1
CATH FOLEY 2WAY SLVR 5CC 16FR (CATHETERS) ×1 IMPLANT
CLOTH BEACON ORANGE TIMEOUT ST (SAFETY) ×2 IMPLANT
COVER MAYO STAND STRL (DRAPES) ×4 IMPLANT
COVER TABLE BACK 60X90 (DRAPES) ×2 IMPLANT
DERMABOND ADVANCED (GAUZE/BANDAGES/DRESSINGS) ×1
DERMABOND ADVANCED .7 DNX12 (GAUZE/BANDAGES/DRESSINGS) ×1 IMPLANT
DISSECTOR ROUND CHERRY 3/8 STR (MISCELLANEOUS) IMPLANT
DRAPE INCISE IOBAN 66X45 STRL (DRAPES) ×2 IMPLANT
DRAPE LAPAROTOMY TRNSV 102X78 (DRAPE) ×2 IMPLANT
DRSG TEGADERM 4X4.75 (GAUZE/BANDAGES/DRESSINGS) IMPLANT
ELECT REM PT RETURN 9FT ADLT (ELECTROSURGICAL) ×2
ELECTRODE REM PT RTRN 9FT ADLT (ELECTROSURGICAL) ×1 IMPLANT
GLOVE BIO SURGEON STRL SZ8 (GLOVE) ×2 IMPLANT
GOWN STRL REIN XL XLG (GOWN DISPOSABLE) ×2 IMPLANT
GOWN W/COTTON TOWEL STD LRG (GOWNS) ×2 IMPLANT
GOWN XL W/COTTON TOWEL STD (GOWNS) ×2 IMPLANT
HOLDER FOLEY CATH W/STRAP (MISCELLANEOUS) ×2 IMPLANT
HOOK RETRACTION 12 ELAST STAY (MISCELLANEOUS) IMPLANT
NS IRRIG 500ML POUR BTL (IV SOLUTION) ×2 IMPLANT
PACK BASIN DAY SURGERY FS (CUSTOM PROCEDURE TRAY) ×2 IMPLANT
PENCIL BUTTON HOLSTER BLD 10FT (ELECTRODE) ×2 IMPLANT
PLUG CATH AND CAP STER (CATHETERS) ×2 IMPLANT
PUMP SET TILAN CYL SCROTAL 20C ×2 IMPLANT
RESERVOIR 75CC LOCKOUT BIOFLEX (Erectile Restoration) ×2 IMPLANT
RETRACTOR STAY HOOK 5MM (MISCELLANEOUS) IMPLANT
RETRACTOR STERILE 25.8CMX11.3 (INSTRUMENTS) IMPLANT
RETRACTOR WILSON SYSTEM (INSTRUMENTS) ×2 IMPLANT
SPONGE LAP 4X18 X RAY DECT (DISPOSABLE) ×2 IMPLANT
STAPLER VISISTAT (STAPLE) IMPLANT
STRIP CLOSURE SKIN 1/2X4 (GAUZE/BANDAGES/DRESSINGS) IMPLANT
SUPPORT SCROTAL LG STRP (MISCELLANEOUS) IMPLANT
SUPPORT SCROTAL MED ADLT STRP (MISCELLANEOUS) ×2 IMPLANT
SURGILUBE 2OZ TUBE FLIPTOP (MISCELLANEOUS) ×2 IMPLANT
SUT CHROMIC 3 0 SH 27 (SUTURE) ×2 IMPLANT
SUT MNCRL AB 4-0 PS2 18 (SUTURE) ×6 IMPLANT
SUT VIC AB 2-0 UR5 27 (SUTURE) ×10 IMPLANT
SUT VIC AB 5-0 PS2 18 (SUTURE) IMPLANT
SYR 20CC LL (SYRINGE) ×2 IMPLANT
SYR 50ML LL SCALE MARK (SYRINGE) ×4 IMPLANT
SYR 5ML LL (SYRINGE) ×2 IMPLANT
SYR BULB IRRIGATION 50ML (SYRINGE) ×2 IMPLANT
TOWEL OR 17X24 6PK STRL BLUE (TOWEL DISPOSABLE) ×4 IMPLANT
TRAY DSU PREP LF (CUSTOM PROCEDURE TRAY) ×2 IMPLANT
TUBE CONNECTING 12X1/4 (SUCTIONS) ×2 IMPLANT
WATER STERILE IRR 500ML POUR (IV SOLUTION) ×2 IMPLANT
YANKAUER SUCT BULB TIP NO VENT (SUCTIONS) ×2 IMPLANT

## 2012-11-09 NOTE — Progress Notes (Signed)
Patient alert able to verbalize needs medicated for pain (see MAR)  Dsg CDI ice pack in place call light in reach.

## 2012-11-09 NOTE — H&P (Signed)
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 He first presented in early 2013 with a history of ED for over 4 years. He has failed oral PDE-5 inhibitors, PGE injections and does not like to use the VED. He has been counseled extensively in further/surgical management of Ed. At this point, he has requested that we proceed with placement of a penile prosthesis. He is aware of the surgical procedure, as well as potential risks and complications, including but not limited to bleeding, infection, possible need for removal/replacement, malfunction, among others. He desires to proceed.  PMH: Past Medical History  Diagnosis Date  . H/O: hypertension     AND HYPERLIPID--  NO MEDS SINCE 2012 DUE TO WT LOSS  . ED (erectile dysfunction)     PSH: Past Surgical History  Procedure Laterality Date  . Laparoscopic cholecystectomy  01-17-2011  . Exploratory laparotomy  AUG 2013    W/ LYSIS ADHESIONS FOR BOWEL OBSTRUCTION  . Laparoscopic heller myotomy  7/13    Allergies: No Known Allergies  Medications: Prescriptions prior to admission  Medication Sig Dispense Refill  . HYDROcodone-acetaminophen (NORCO/VICODIN) 5-325 MG per tablet Take 1 tablet by mouth every 6 (six) hours as needed for pain.      Marland Kitchen LORazepam (ATIVAN) 0.5 MG tablet Take 0.5 mg by mouth at bedtime as needed for anxiety.      . Methylcellulose, Laxative, (CITRUCEL PO) Take by mouth every 3 (three) days.      . metoprolol tartrate (LOPRESSOR) 25 MG tablet Take 25 mg by mouth 2 (two) times daily.      . Polyethylene Glycol 3350 (MIRALAX PO) Take by mouth every 3 (three) days.      . traMADol (ULTRAM) 50 MG tablet Take 50 mg by mouth every 8 (eight) hours as needed for pain.      Marland Kitchen dicyclomine (BENTYL) 10 MG capsule Take 10 mg by mouth 3 (three) times daily.        . mirtazapine (REMERON) 15 MG tablet Take 15 mg by mouth at  bedtime.        . ondansetron (ZOFRAN) 4 MG tablet Take 4 mg by mouth once as needed. For nausea       . OXYCODONE HCL PO Take 1 tablet by mouth every 4 (four) hours as needed. For pain       . pantoprazole (PROTONIX) 40 MG tablet Take 40 mg by mouth daily.           Social History: History   Social History  . Marital Status: Married    Spouse Name: N/A    Number of Children: N/A  . Years of Education: N/A   Occupational History  . Not on file.   Social History Main Topics  . Smoking status: Never Smoker   . Smokeless tobacco: Not on file  . Alcohol Use: No  . Drug Use: No  . Sexually Active: Not on file   Other Topics Concern  . Not on file   Social History Narrative  . No narrative on file    Family History: No family history on file.  Review of Systems: Positive: Inability to obtain/maintain erections Negative:  A further 10 point review of systems was negative except what is listed in the HPI.  Physical Exam: @VITALS2 @ General: No acute distress.  Awake. Head:  Normocephalic.  Atraumatic. ENT:  EOMI.  Mucous membranes moist Neck:  Supple.  No lymphadenopathy. CV:  S1 present. S2 present. Regular rate. Pulmonary: Equal effort bilaterally.  Clear to auscultation bilaterally. Abdomen: Soft.  Non tender to palpation. Skin:  Normal turgor.  No visible rash. Extremity: No gross deformity of bilateral upper extremities.  No gross deformity of    bilateral lower extremities. Neurologic: Alert. Appropriate mood.  Penis:  Circumcised.  No lesions. Urethra:   Orthotopic meatus. Scrotum: No lesions.  No ecchymosis.  No erythema. Testicles: Descended bilaterally.  No masses bilaterally. Epididymis: Palpable bilaterally. Non Tender to palpation.  Studies:  No results found for this basename: HGB, WBC, PLT,  in the last 72 hours  No results found for this basename: NA, K, CL, CO2, BUN, CREATININE, CALCIUM, MAGNESIUM, GFRNONAA, GFRAA,  in the last 72 hours   No  results found for this basename: PT, INR, APTT,  in the last 72 hours   No components found with this basename: ABG,     Assessment:  Organic erectile dysfunction   Plan: Placement of 3 piece inflatable penile prosthesis

## 2012-11-09 NOTE — Anesthesia Procedure Notes (Signed)
Procedure Name: LMA Insertion Date/Time: 11/09/2012 7:33 AM Performed by: Maris Berger T Pre-anesthesia Checklist: Patient identified, Emergency Drugs available, Suction available and Patient being monitored Patient Re-evaluated:Patient Re-evaluated prior to inductionOxygen Delivery Method: Circle System Utilized Preoxygenation: Pre-oxygenation with 100% oxygen Intubation Type: IV induction Ventilation: Mask ventilation without difficulty LMA: LMA inserted LMA Size: 5.0 Number of attempts: 1 Airway Equipment and Method: bite block Placement Confirmation: positive ETCO2 Dental Injury: Teeth and Oropharynx as per pre-operative assessment

## 2012-11-09 NOTE — Interval H&P Note (Signed)
History and Physical Interval Note:  11/09/2012 7:32 AM  Logan Arnold  has presented today for surgery, with the diagnosis of ERECTILE DYSFUNCTION  The various methods of treatment have been discussed with the patient and family. After consideration of risks, benefits and other options for treatment, the patient has consented to  Procedure(s) with comments: PENILE PROTHESIS INFLATABLE (N/A) - PLACEMENT OF COLOPLAST 3 PIECE INFLATABLE PENILE PROSTHESIS  "TITAN TOUCH"   as a surgical intervention .  The patient's history has been reviewed, patient examined, no change in status, stable for surgery.  I have reviewed the patient's chart and labs.  Questions were answered to the patient's satisfaction.     Chelsea Aus

## 2012-11-09 NOTE — Anesthesia Preprocedure Evaluation (Addendum)
Anesthesia Evaluation  Patient identified by MRN, date of birth, ID band Patient awake    Reviewed: Allergy & Precautions, H&P , NPO status , Patient's Chart, lab work & pertinent test results  Airway Mallampati: I TM Distance: >3 FB Neck ROM: Full    Dental  (+) Dental Advisory Given, Teeth Intact and Chipped,    Pulmonary neg pulmonary ROS,  breath sounds clear to auscultation        Cardiovascular hypertension, Pt. on medications negative cardio ROS  Rhythm:Regular Rate:Normal     Neuro/Psych PSYCHIATRIC DISORDERS negative neurological ROS     GI/Hepatic negative GI ROS, Neg liver ROS,   Endo/Other  negative endocrine ROS  Renal/GU negative Renal ROS     Musculoskeletal negative musculoskeletal ROS (+)   Abdominal   Peds  Hematology negative hematology ROS (+)   Anesthesia Other Findings   Reproductive/Obstetrics                         Anesthesia Physical Anesthesia Plan  ASA: II  Anesthesia Plan: General   Post-op Pain Management:    Induction: Intravenous  Airway Management Planned: LMA  Additional Equipment:   Intra-op Plan:   Post-operative Plan: Extubation in OR  Informed Consent: I have reviewed the patients History and Physical, chart, labs and discussed the procedure including the risks, benefits and alternatives for the proposed anesthesia with the patient or authorized representative who has indicated his/her understanding and acceptance.   Dental advisory given  Plan Discussed with: CRNA  Anesthesia Plan Comments:         Anesthesia Quick Evaluation

## 2012-11-09 NOTE — Transfer of Care (Signed)
Immediate Anesthesia Transfer of Care Note  Patient: Logan Arnold  Procedure(s) Performed: Procedure(s) with comments: PENILE PROTHESIS INFLATABLE (N/A) - PLACEMENT OF COLOPLAST 3 PIECE INFLATABLE PENILE PROSTHESIS  "TITAN TOUCH"    Patient Location: PACU  Anesthesia Type:General  Level of Consciousness: alert  and oriented  Airway & Oxygen Therapy: Patient Spontanous Breathing and Patient connected to nasal cannula oxygen  Post-op Assessment: Report given to PACU RN  Post vital signs: Reviewed and stable  Complications: No apparent anesthesia complications

## 2012-11-09 NOTE — Progress Notes (Signed)
Received report  For lunch relief

## 2012-11-09 NOTE — Anesthesia Postprocedure Evaluation (Signed)
Anesthesia Post Note  Patient: Logan Arnold  Procedure(s) Performed: Procedure(s) (LRB): PENILE PROTHESIS INFLATABLE (N/A)  Anesthesia type: General  Patient location: PACU  Post pain: Pain level controlled  Post assessment: Post-op Vital signs reviewed  Last Vitals: BP 107/65  Pulse 75  Temp(Src) 36.5 C (Oral)  Resp 12  Ht 5\' 6"  (1.676 m)  Wt 169 lb (76.658 kg)  BMI 27.29 kg/m2  SpO2 99%  Post vital signs: Reviewed  Level of consciousness: sedated  Complications: No apparent anesthesia complications

## 2012-11-09 NOTE — Preoperative (Signed)
Beta Blockers   Reason not to administer Beta Blockers:Hold beta blocker due to other 

## 2012-11-09 NOTE — Op Note (Signed)
Preoperative diagnosis: Erectile dysfunction, organic  Postoperative diagnosis: Same   Procedure: Placement of three-piece Coloplast inflatable penile prosthesis (20 cm with 1 cm rear tip extender    Surgeon: Bertram Millard. Rolando Hessling, M.D.   Anesthesia: Gen.   Complications: None  Specimen(s): None  Drain(s): None except for Foley catheter  Indications: 47 year old male with significant erectile dysfunction, the patient failed both oral PDE 5 inhibitors and prostaglandin injections. He has requested surgical management. We have discussed this with him over the past year. Risks and complications of the procedure have been discussed with and are understood by the patient. These include but are not limited to infection, bleeding, device malfunction, and need for explant, urethral perforation, pain, among others. He understands these and desires to proceed.    Technique and findings: The patient was properly identified in the holding area, and received preoperative IV antibiotics. He was taken to the operating room where general anesthetic was administered with the LMA. He was placed in the frog-leg position. Genitalia perineum and lower abdomen were first scrubbed with Betadine. The Betadine soap was dried, and ChloraPrep was placed on the patient's abdomen, genitalia and perineum. At this point, sterile drapes were placed. Additionally, an iodine drape was applied. His urethra and bladder were catheterized with a 16 French Foley catheter. This was plugged. Appropriate timeout was then performed.  I then made a 4 cm incision in a transverse fashion at the penoscrotal junction. This was carried down to Buck's fascia with electrocautery and blunt dissection. I then properly identified the urethra, and skeletonized the urethra and the corporal bodies proximally. Stay sutures were then placed laterally on the corporal bodies on both sides, using 2-0 Vicryl sutures. I then made corporotomies in a  longitudinal direction bilaterally. Access to the corporal bodies was then obtained. I then dissected proximally and distally the corporal bodies with the Metzenbaum scissors, and then dilated proximally and distally, and proximally using the 12 dilator, and distally using the 11. There was significant scarring in the left distal corporal body. This was eventually dilated. The corporal bodies were then measured-at 21 cm. The left distal measurement was 11, the left proximal 10 cm. The right distal measurement was 11, the proximal 10 cm. It shows a 20 cm prosthesis with a 1 cm rear tip extender. The corpora were incised about 1 cm proximally to allow the pump to get to the base of the scrotum. While the cylinders were being prepared, I dissected to the external inguinal ring on the right, and perforated the transversalis fascia just medial to this. I then, with the finger, dissected into the space of Retzius, creating a space for the reservoir, which was then placed appropriately. Because of the very small puncture, I did not need to close this incision. I filled the reservoir with 65 cc of fluid. I then placed the cylinders into the appropriate corporal bodies using the Furlow inserter and a Mellody Dance needle. They were appropriately positioned. Corporotomies were then closed with 2-0 Vicryl sutures. Surrogate direction was in performed, and there was a straight direction, and the cylinders were felt to be appropriately positioned. The cylinders were totally empty, and then filled with 15 cc of fluid. Appropriate connection was then made between the pump and the reservoir. A subdartos pouch was constructed the base of the scrotum, and the pump appropriate position. It should be noted that all components were placed and antibiotic solution prior to their placement. I then closed the dartos fascia around the pump and  the pump tubing, closing the anterior scrotum and a vertical fashion. I then closed the outer layer dartos  fascia in a transverse fashion along the edges of the incision using another 2-0 chromic is a running fashion. I then closed the skin using 4-0 Monocryl placed in a running simple fashion. The pump was appropriately positioned the base of the scrotum. At this point, Foley catheter was left in, the penis was partially inflated, approximately 1/3 full. The sutures at the distal end of the implant were then removed. Dermabond was placed on the scrotal incision. The scrotum was an appropriately dressed with a fluff dressing and jockstrap. The patient was then awakened and taken to PACU in stable condition. He tolerated the procedure well.

## 2012-11-10 ENCOUNTER — Encounter (HOSPITAL_BASED_OUTPATIENT_CLINIC_OR_DEPARTMENT_OTHER): Payer: Self-pay | Admitting: Urology

## 2012-11-10 NOTE — Progress Notes (Signed)
Foley cath discontinued as ordered. Ice pack to perineal area.

## 2012-11-10 NOTE — Discharge Summary (Signed)
Physician Discharge Summary  Patient ID: Logan Arnold MRN: [id number] DOB/AGE: [birth year] 47 y.o.  Admit date: 11/09/2012 Discharge date: 11/10/2012  Admission Diagnoses: ED  Discharge Diagnoses: ED Active Problems:   * No active hospital problems. *   Discharged Condition: good  Hospital Course: [name] had a 3 piece penile prosthesis placed on 2/17.  He is doing well without pain.  He has had his foley out for 2 hours but has not voided yet.   Consults: None  Significant Diagnostic Studies: none  Treatments: surgery: as above  Discharge Exam: Blood pressure 109/54, pulse 69, temperature 98.1 F (36.7 C), temperature source Oral, resp. rate 20, height 5\' 6"  (1.676 m), weight 76.658 kg (169 lb), SpO2 97.00%. General appearance: alert and no distress Male genitalia: normal, His incision is intact and there is minimal swelling or ecchymosis.    Disposition: 01-Home or Self Care     Medication List    TAKE these medications       ciprofloxacin 500 MG tablet  Commonly known as:  CIPRO  Take 1 tablet (500 mg total) by mouth 2 (two) times daily.     CITRUCEL PO  Take by mouth every 3 (three) days.     dicyclomine 10 MG capsule  Commonly known as:  BENTYL  Take 10 mg by mouth 3 (three) times daily.     HYDROcodone-acetaminophen 5-325 MG per tablet  Commonly known as:  NORCO/VICODIN  Take 1 tablet by mouth every 6 (six) hours as needed for pain.     LORazepam 0.5 MG tablet  Commonly known as:  ATIVAN  Take 0.5 mg by mouth at bedtime as needed for anxiety.     metoprolol tartrate 25 MG tablet  Commonly known as:  LOPRESSOR  Take 25 mg by mouth 2 (two) times daily.     MIRALAX PO  Take by mouth every 3 (three) days.     mirtazapine 15 MG tablet  Commonly known as:  REMERON  Take 15 mg by mouth at bedtime.     ondansetron 4 MG tablet  Commonly known as:  ZOFRAN  Take 4 mg by mouth once as needed. For nausea     OXYCODONE HCL PO  Take 1 tablet by mouth  every 4 (four) hours as needed. For pain     pantoprazole 40 MG tablet  Commonly known as:  PROTONIX  Take 40 mg by mouth daily.     traMADol 50 MG tablet  Commonly known as:  ULTRAM  Take 50 mg by mouth every 8 (eight) hours as needed for pain.           Follow-up Information   Follow up with Chelsea Aus, MD. (as scheduled)    Contact information:   [address] [place] [address] [phone]       Signed: Anner Crete 11/10/2012, 8:01 AM

## 2013-06-19 ENCOUNTER — Encounter (HOSPITAL_COMMUNITY): Payer: Self-pay | Admitting: *Deleted

## 2013-06-19 ENCOUNTER — Emergency Department (HOSPITAL_COMMUNITY)
Admission: EM | Admit: 2013-06-19 | Discharge: 2013-06-19 | Disposition: A | Discharge status: Home / Self Care | Payer: 59 | Attending: Emergency Medicine | Admitting: Emergency Medicine

## 2013-06-19 DIAGNOSIS — Y92410 Unspecified street and highway as the place of occurrence of the external cause: Secondary | ICD-10-CM | POA: Insufficient documentation

## 2013-06-19 DIAGNOSIS — Z79899 Other long term (current) drug therapy: Secondary | ICD-10-CM | POA: Insufficient documentation

## 2013-06-19 DIAGNOSIS — Z87448 Personal history of other diseases of urinary system: Secondary | ICD-10-CM | POA: Insufficient documentation

## 2013-06-19 DIAGNOSIS — M7918 Myalgia, other site: Secondary | ICD-10-CM

## 2013-06-19 DIAGNOSIS — I10 Essential (primary) hypertension: Secondary | ICD-10-CM | POA: Insufficient documentation

## 2013-06-19 DIAGNOSIS — S59909A Unspecified injury of unspecified elbow, initial encounter: Secondary | ICD-10-CM | POA: Insufficient documentation

## 2013-06-19 DIAGNOSIS — Y9389 Activity, other specified: Secondary | ICD-10-CM | POA: Insufficient documentation

## 2013-06-19 DIAGNOSIS — S6990XA Unspecified injury of unspecified wrist, hand and finger(s), initial encounter: Secondary | ICD-10-CM | POA: Insufficient documentation

## 2013-06-19 MED ORDER — TRAMADOL HCL 50 MG PO TABS: 50.0000 mg | ORAL_TABLET | Freq: Four times a day (QID) | ORAL | Status: AC | PRN
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
## 2013-06-20 NOTE — ED Provider Notes (Signed)
Medical screening examination/treatment/procedure(s) were performed by non-physician practitioner and as supervising physician I was immediately available for consultation/collaboration.   Danette Weinfeld, MD 06/20/13 2319 

## 2013-06-28 ENCOUNTER — Emergency Department (HOSPITAL_BASED_OUTPATIENT_CLINIC_OR_DEPARTMENT_OTHER)
Admission: EM | Admit: 2013-06-28 | Discharge: 2013-06-29 | Disposition: A | Discharge status: Home / Self Care | Payer: 59 | Attending: Emergency Medicine | Admitting: Emergency Medicine

## 2013-06-28 ENCOUNTER — Emergency Department (HOSPITAL_BASED_OUTPATIENT_CLINIC_OR_DEPARTMENT_OTHER): Payer: 59

## 2013-06-28 ENCOUNTER — Encounter (HOSPITAL_BASED_OUTPATIENT_CLINIC_OR_DEPARTMENT_OTHER): Payer: Self-pay | Admitting: *Deleted

## 2013-06-28 DIAGNOSIS — M25519 Pain in unspecified shoulder: Secondary | ICD-10-CM | POA: Insufficient documentation

## 2013-06-28 DIAGNOSIS — Z862 Personal history of diseases of the blood and blood-forming organs and certain disorders involving the immune mechanism: Secondary | ICD-10-CM | POA: Insufficient documentation

## 2013-06-28 DIAGNOSIS — S0990XA Unspecified injury of head, initial encounter: Secondary | ICD-10-CM

## 2013-06-28 DIAGNOSIS — Z8639 Personal history of other endocrine, nutritional and metabolic disease: Secondary | ICD-10-CM | POA: Insufficient documentation

## 2013-06-28 DIAGNOSIS — M25511 Pain in right shoulder: Secondary | ICD-10-CM

## 2013-06-28 DIAGNOSIS — I10 Essential (primary) hypertension: Secondary | ICD-10-CM | POA: Insufficient documentation

## 2013-06-28 DIAGNOSIS — Z87448 Personal history of other diseases of urinary system: Secondary | ICD-10-CM | POA: Insufficient documentation

## 2013-06-28 DIAGNOSIS — G8911 Acute pain due to trauma: Secondary | ICD-10-CM | POA: Insufficient documentation

## 2013-06-28 DIAGNOSIS — Z79899 Other long term (current) drug therapy: Secondary | ICD-10-CM | POA: Insufficient documentation

## 2013-06-28 DIAGNOSIS — R51 Headache: Secondary | ICD-10-CM | POA: Insufficient documentation
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
## 2013-06-29 MED ORDER — HYDROCODONE-ACETAMINOPHEN 5-325 MG PO TABS: 2.0000 | ORAL_TABLET | ORAL | Status: DC | PRN

## 2013-06-29 NOTE — ED Notes (Signed)
Pt ambulating independently w/ steady gait on d/c in no acute distress, A&Ox4. D/c instructions reviewed w/ pt and family - pt and family deny any further questions or concerns at present. Rx given x1  

## 2013-07-07 ENCOUNTER — Other Ambulatory Visit (HOSPITAL_COMMUNITY): Payer: Self-pay | Admitting: Chiropractic Medicine

## 2013-07-07 ENCOUNTER — Ambulatory Visit (HOSPITAL_COMMUNITY)
Admission: RE | Admit: 2013-07-07 | Discharge: 2013-07-07 | Disposition: A | Discharge status: Home / Self Care | Payer: 59 | Source: Ambulatory Visit | Attending: Chiropractic Medicine | Admitting: Chiropractic Medicine

## 2013-07-07 DIAGNOSIS — R52 Pain, unspecified: Secondary | ICD-10-CM

## 2013-07-07 DIAGNOSIS — M545 Low back pain, unspecified: Secondary | ICD-10-CM | POA: Insufficient documentation

## 2013-07-07 DIAGNOSIS — M542 Cervicalgia: Secondary | ICD-10-CM | POA: Insufficient documentation

## 2014-12-14 ENCOUNTER — Emergency Department (HOSPITAL_BASED_OUTPATIENT_CLINIC_OR_DEPARTMENT_OTHER): Payer: No Typology Code available for payment source

## 2014-12-14 ENCOUNTER — Encounter (HOSPITAL_BASED_OUTPATIENT_CLINIC_OR_DEPARTMENT_OTHER): Payer: Self-pay | Admitting: Emergency Medicine

## 2014-12-14 ENCOUNTER — Emergency Department (HOSPITAL_BASED_OUTPATIENT_CLINIC_OR_DEPARTMENT_OTHER)
Admission: EM | Admit: 2014-12-14 | Discharge: 2014-12-14 | Disposition: A | Discharge status: Home / Self Care | Payer: No Typology Code available for payment source | Attending: Emergency Medicine | Admitting: Emergency Medicine

## 2014-12-14 DIAGNOSIS — Z8719 Personal history of other diseases of the digestive system: Secondary | ICD-10-CM | POA: Insufficient documentation

## 2014-12-14 DIAGNOSIS — S3992XA Unspecified injury of lower back, initial encounter: Secondary | ICD-10-CM | POA: Diagnosis present

## 2014-12-14 DIAGNOSIS — I10 Essential (primary) hypertension: Secondary | ICD-10-CM | POA: Diagnosis not present

## 2014-12-14 DIAGNOSIS — Z79899 Other long term (current) drug therapy: Secondary | ICD-10-CM | POA: Diagnosis not present

## 2014-12-14 DIAGNOSIS — Y9389 Activity, other specified: Secondary | ICD-10-CM | POA: Insufficient documentation

## 2014-12-14 DIAGNOSIS — Y92410 Unspecified street and highway as the place of occurrence of the external cause: Secondary | ICD-10-CM | POA: Diagnosis not present

## 2014-12-14 DIAGNOSIS — S39012A Strain of muscle, fascia and tendon of lower back, initial encounter: Secondary | ICD-10-CM | POA: Diagnosis not present

## 2014-12-14 DIAGNOSIS — Z87448 Personal history of other diseases of urinary system: Secondary | ICD-10-CM | POA: Diagnosis not present

## 2014-12-14 DIAGNOSIS — Y998 Other external cause status: Secondary | ICD-10-CM | POA: Insufficient documentation

## 2014-12-14 HISTORY — DX: Gastroparesis: K31.84

## 2014-12-14 MED ORDER — HYDROCODONE-ACETAMINOPHEN 5-325 MG PO TABS: 1.0000 | ORAL_TABLET | Freq: Four times a day (QID) | ORAL | Status: AC | PRN
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
## 2015-06-17 ENCOUNTER — Encounter (HOSPITAL_COMMUNITY): Payer: Self-pay | Admitting: Emergency Medicine

## 2015-06-17 ENCOUNTER — Emergency Department (HOSPITAL_COMMUNITY)
Admission: EM | Admit: 2015-06-17 | Discharge: 2015-06-17 | Disposition: A | Discharge status: Home / Self Care | Payer: 59 | Attending: Emergency Medicine | Admitting: Emergency Medicine

## 2015-06-17 DIAGNOSIS — Z87438 Personal history of other diseases of male genital organs: Secondary | ICD-10-CM | POA: Diagnosis not present

## 2015-06-17 DIAGNOSIS — Z8719 Personal history of other diseases of the digestive system: Secondary | ICD-10-CM | POA: Insufficient documentation

## 2015-06-17 DIAGNOSIS — F419 Anxiety disorder, unspecified: Secondary | ICD-10-CM | POA: Insufficient documentation

## 2015-06-17 DIAGNOSIS — G479 Sleep disorder, unspecified: Secondary | ICD-10-CM | POA: Diagnosis not present

## 2015-06-17 DIAGNOSIS — I10 Essential (primary) hypertension: Secondary | ICD-10-CM | POA: Insufficient documentation

## 2015-06-17 DIAGNOSIS — Z79899 Other long term (current) drug therapy: Secondary | ICD-10-CM | POA: Insufficient documentation

## 2015-06-17 DIAGNOSIS — G4700 Insomnia, unspecified: Secondary | ICD-10-CM | POA: Diagnosis present

## 2015-06-17 MED ORDER — TEMAZEPAM 30 MG PO CAPS: 30.0000 mg | ORAL_CAPSULE | Freq: Every evening | ORAL | Status: AC | PRN

## 2015-06-17 NOTE — ED Notes (Addendum)
Pt states that his father passed away 06/18/15 and the patient has had difficulty sleeping since then.  Patient is requesting a medication to help him sleep through the night.  States he has taken benadryl but it makes him feel groggy and it doesn't make him sleep for longer than a few hours. Patient in no apparent distress at this time.

## 2015-06-17 NOTE — ED Provider Notes (Signed)
CSN: [id number]     Arrival date & time 06/17/15  1122 History   First MD Initiated Contact with Patient 06/17/15 1138     Chief Complaint  Patient presents with  . Insomnia     (Consider location/radiation/quality/duration/timing/severity/associated sxs/prior Treatment) HPI Logan Arnold is a 49 y.o. male with history of gastroparesis, comes in for evaluation of insomnia. Patient states his father passed away suddenly on 2024-10-01and he has had difficulty sleeping since that time. He reports a certain degree of anxiety, will only be able to sleep for an hour or so at night before he wakes up again. Has tried Benadryl, but only works for a few hours and also makes him feel groggy. Denies any SI or HI. No auditory or visual hallucinations. Denies any other medical complaints at this time. No alcohol or illicit drug use. No other aggravating or modifying factors.  Past Medical History  Diagnosis Date  . H/O: hypertension     AND HYPERLIPID--  NO MEDS SINCE 2012 DUE TO WT LOSS  . ED (erectile dysfunction)   . Gastroparesis    Past Surgical History  Procedure Laterality Date  . Laparoscopic cholecystectomy  01-17-2011  . Exploratory laparotomy  AUG 2013    W/ LYSIS ADHESIONS FOR BOWEL OBSTRUCTION  . Laparoscopic heller myotomy  7/13  . Penile prosthesis implant N/A 11/09/2012    Procedure: PENILE PROTHESIS INFLATABLE;  Surgeon: Marcine Matar, MD;  Location: [facility];  Service: Urology;  Laterality: N/A;  PLACEMENT OF COLOPLAST 3 PIECE INFLATABLE PENILE PROSTHESIS  "TITAN TOUCH"     No family history on file. Social History  Substance Use Topics  . Smoking status: Never Smoker   . Smokeless tobacco: None  . Alcohol Use: No    Review of Systems A 10 point review of systems was completed and was negative except for pertinent positives and negatives as mentioned in the history of present illness     Allergies  Review of patient's allergies indicates  no known allergies.  Home Medications   Prior to Admission medications   Medication Sig Start Date End Date Taking? Authorizing Provider  HYDROcodone-acetaminophen (NORCO/VICODIN) 5-325 MG per tablet Take 1-2 tablets by mouth every 6 (six) hours as needed (for pain). 12/14/14   John Molpus, MD  ondansetron (ZOFRAN) 4 MG tablet Take 4 mg by mouth every 8 (eight) hours as needed for nausea or vomiting.    Historical Provider, MD  pantoprazole (PROTONIX) 20 MG tablet Take 20 mg by mouth daily.    Historical Provider, MD  traMADol (ULTRAM) 50 MG tablet Take 1 tablet (50 mg total) by mouth every 6 (six) hours as needed for pain. 06/19/13   Nicole Pisciotta, PA-C   BP 126/73 mmHg  Pulse 70  Temp(Src) 98.7 F (37.1 C) (Oral)  Resp 12  SpO2 98% Physical Exam  Constitutional: He appears well-developed. No distress.  Awake, alert, nontoxic appearance.  HENT:  Head: Atraumatic.  Eyes: EOM are normal. Right eye exhibits no discharge. Left eye exhibits no discharge.  Neck: Normal range of motion. Neck supple.  Cardiovascular: Normal rate, regular rhythm and normal heart sounds.   Pulmonary/Chest: Effort normal and breath sounds normal. No respiratory distress. He has no wheezes. He has no rales. He exhibits no tenderness.  Abdominal: Soft. There is no tenderness. There is no rebound.  Musculoskeletal: He exhibits no tenderness.  Baseline ROM, no obvious new focal weakness.  Neurological:  Mental status and motor strength appears  baseline for patient and situation.  Skin: No rash noted. He is not diaphoretic.  Psychiatric: He has a normal mood and affect. His behavior is normal. Judgment and thought content normal.  Nursing note and vitals reviewed.   ED Course  Procedures (including critical care time) Labs Review Labs Reviewed - No data to display  Imaging Review No results found. I have personally reviewed and evaluated these images and lab results as part of my medical  decision-making.   EKG Interpretation None     Filed Vitals:   06/17/15 1133  BP: 126/73  Pulse: 70  Temp: 98.7 F (37.1 C)  TempSrc: Oral  Resp: 12  SpO2: 98%    MDM  Vitals stable - WNL -afebrile Pt resting comfortably in ED. PE-physical exam as above and is unremarkable. Patient here for sleep disorder and anxiety secondary to recent loss of his father. I will write for short course Restoril to help with sleeping. Discussed this is a benzodiazepine and patient will need to try to taper off of medication that did not stop abruptly as this can lead to dangerous withdrawal symptoms. Patient also has PCP that he will be able to see for follow-up and further evaluation and management. No evidence of other acute or emergent pathology at this time. I discussed all relevant lab findings and imaging results with pt and they verbalized understanding. Discussed f/u with PCP within 48 hrs and return precautions, pt very amenable to plan.  Final diagnoses:  Sleep disorder  Anxiety        Joycie Peek, PA-C 06/17/15 1157  Alvira Monday, MD 06/20/15 [phone]

## 2015-06-17 NOTE — Discharge Instructions (Signed)
Is important to follow up with your primary care doctor as needed for reevaluation please take your medications as prescribed. Only take your medication before bedtime. Do not take this medication before driving or operating machinery as it can make you very sleepy. It is also important for you to try to taper off of this medication as sudden discontinuance can cause dangerous withdrawal symptoms.  Return to ED for new or worsening symptoms.

## 2017-05-13 DIAGNOSIS — R1013 Epigastric pain: Secondary | ICD-10-CM | POA: Diagnosis not present

## 2017-05-13 DIAGNOSIS — R1310 Dysphagia, unspecified: Secondary | ICD-10-CM | POA: Diagnosis not present

## 2017-05-13 DIAGNOSIS — K220 Achalasia of cardia: Secondary | ICD-10-CM | POA: Diagnosis not present

## 2017-07-17 DIAGNOSIS — R1319 Other dysphagia: Secondary | ICD-10-CM | POA: Diagnosis not present

## 2017-07-17 DIAGNOSIS — R1310 Dysphagia, unspecified: Secondary | ICD-10-CM | POA: Diagnosis not present

## 2017-07-17 DIAGNOSIS — K220 Achalasia of cardia: Secondary | ICD-10-CM | POA: Diagnosis not present

## 2017-08-26 DIAGNOSIS — M9905 Segmental and somatic dysfunction of pelvic region: Secondary | ICD-10-CM | POA: Diagnosis not present

## 2017-08-26 DIAGNOSIS — M5386 Other specified dorsopathies, lumbar region: Secondary | ICD-10-CM | POA: Diagnosis not present

## 2017-08-26 DIAGNOSIS — M9903 Segmental and somatic dysfunction of lumbar region: Secondary | ICD-10-CM | POA: Diagnosis not present

## 2017-08-28 DIAGNOSIS — M9905 Segmental and somatic dysfunction of pelvic region: Secondary | ICD-10-CM | POA: Diagnosis not present

## 2017-08-28 DIAGNOSIS — M9903 Segmental and somatic dysfunction of lumbar region: Secondary | ICD-10-CM | POA: Diagnosis not present

## 2017-08-28 DIAGNOSIS — M5386 Other specified dorsopathies, lumbar region: Secondary | ICD-10-CM | POA: Diagnosis not present

## 2017-09-03 DIAGNOSIS — M9905 Segmental and somatic dysfunction of pelvic region: Secondary | ICD-10-CM | POA: Diagnosis not present

## 2017-09-03 DIAGNOSIS — M9903 Segmental and somatic dysfunction of lumbar region: Secondary | ICD-10-CM | POA: Diagnosis not present

## 2017-09-03 DIAGNOSIS — M5386 Other specified dorsopathies, lumbar region: Secondary | ICD-10-CM | POA: Diagnosis not present

## 2017-09-04 DIAGNOSIS — M9905 Segmental and somatic dysfunction of pelvic region: Secondary | ICD-10-CM | POA: Diagnosis not present

## 2017-09-04 DIAGNOSIS — M5386 Other specified dorsopathies, lumbar region: Secondary | ICD-10-CM | POA: Diagnosis not present

## 2017-09-04 DIAGNOSIS — M9903 Segmental and somatic dysfunction of lumbar region: Secondary | ICD-10-CM | POA: Diagnosis not present

## 2017-09-29 DIAGNOSIS — M9903 Segmental and somatic dysfunction of lumbar region: Secondary | ICD-10-CM | POA: Diagnosis not present

## 2017-09-29 DIAGNOSIS — M5386 Other specified dorsopathies, lumbar region: Secondary | ICD-10-CM | POA: Diagnosis not present

## 2017-09-29 DIAGNOSIS — M9905 Segmental and somatic dysfunction of pelvic region: Secondary | ICD-10-CM | POA: Diagnosis not present

## 2017-10-02 DIAGNOSIS — M5386 Other specified dorsopathies, lumbar region: Secondary | ICD-10-CM | POA: Diagnosis not present

## 2017-10-02 DIAGNOSIS — M9903 Segmental and somatic dysfunction of lumbar region: Secondary | ICD-10-CM | POA: Diagnosis not present

## 2017-10-02 DIAGNOSIS — M9905 Segmental and somatic dysfunction of pelvic region: Secondary | ICD-10-CM | POA: Diagnosis not present

## 2017-10-15 DIAGNOSIS — R1013 Epigastric pain: Secondary | ICD-10-CM | POA: Diagnosis not present

## 2017-10-15 DIAGNOSIS — K210 Gastro-esophageal reflux disease with esophagitis: Secondary | ICD-10-CM | POA: Diagnosis not present

## 2017-10-15 DIAGNOSIS — K5900 Constipation, unspecified: Secondary | ICD-10-CM | POA: Diagnosis not present

## 2017-12-02 DIAGNOSIS — I480 Paroxysmal atrial fibrillation: Secondary | ICD-10-CM | POA: Diagnosis not present

## 2017-12-03 DIAGNOSIS — Z0000 Encounter for general adult medical examination without abnormal findings: Secondary | ICD-10-CM | POA: Diagnosis not present

## 2017-12-03 DIAGNOSIS — E782 Mixed hyperlipidemia: Secondary | ICD-10-CM | POA: Diagnosis not present

## 2017-12-03 DIAGNOSIS — R002 Palpitations: Secondary | ICD-10-CM | POA: Diagnosis not present

## 2017-12-03 DIAGNOSIS — Z23 Encounter for immunization: Secondary | ICD-10-CM | POA: Diagnosis not present

## 2017-12-03 DIAGNOSIS — G4700 Insomnia, unspecified: Secondary | ICD-10-CM | POA: Diagnosis not present

## 2017-12-25 DIAGNOSIS — R7309 Other abnormal glucose: Secondary | ICD-10-CM | POA: Diagnosis not present

## 2018-01-22 DIAGNOSIS — Z1211 Encounter for screening for malignant neoplasm of colon: Secondary | ICD-10-CM | POA: Diagnosis not present

## 2018-01-22 DIAGNOSIS — K5730 Diverticulosis of large intestine without perforation or abscess without bleeding: Secondary | ICD-10-CM | POA: Diagnosis not present

## 2018-01-22 DIAGNOSIS — K56699 Other intestinal obstruction unspecified as to partial versus complete obstruction: Secondary | ICD-10-CM | POA: Diagnosis not present

## 2018-02-27 DIAGNOSIS — Z1211 Encounter for screening for malignant neoplasm of colon: Secondary | ICD-10-CM | POA: Diagnosis not present

## 2018-05-02 DIAGNOSIS — R079 Chest pain, unspecified: Secondary | ICD-10-CM | POA: Diagnosis not present

## 2018-05-02 DIAGNOSIS — R070 Pain in throat: Secondary | ICD-10-CM | POA: Diagnosis not present

## 2018-05-02 DIAGNOSIS — R1310 Dysphagia, unspecified: Secondary | ICD-10-CM | POA: Diagnosis not present

## 2018-05-04 DIAGNOSIS — R001 Bradycardia, unspecified: Secondary | ICD-10-CM | POA: Diagnosis not present

## 2018-05-06 DIAGNOSIS — K210 Gastro-esophageal reflux disease with esophagitis: Secondary | ICD-10-CM | POA: Diagnosis not present

## 2018-10-14 DIAGNOSIS — Z23 Encounter for immunization: Secondary | ICD-10-CM | POA: Diagnosis not present

## 2018-10-14 DIAGNOSIS — K220 Achalasia of cardia: Secondary | ICD-10-CM | POA: Diagnosis not present

## 2018-10-14 DIAGNOSIS — R1013 Epigastric pain: Secondary | ICD-10-CM | POA: Diagnosis not present

## 2019-01-14 ENCOUNTER — Emergency Department (HOSPITAL_COMMUNITY): Payer: 59

## 2019-01-14 ENCOUNTER — Other Ambulatory Visit: Payer: Self-pay

## 2019-01-14 ENCOUNTER — Encounter (HOSPITAL_COMMUNITY): Payer: Self-pay | Admitting: Emergency Medicine

## 2019-01-14 ENCOUNTER — Emergency Department (HOSPITAL_COMMUNITY)
Admission: EM | Admit: 2019-01-14 | Discharge: 2019-01-14 | Disposition: A | Discharge status: Home / Self Care | Payer: 59 | Attending: Emergency Medicine | Admitting: Emergency Medicine

## 2019-01-14 DIAGNOSIS — R10812 Left upper quadrant abdominal tenderness: Secondary | ICD-10-CM | POA: Insufficient documentation

## 2019-01-14 DIAGNOSIS — R51 Headache: Secondary | ICD-10-CM | POA: Insufficient documentation

## 2019-01-14 DIAGNOSIS — R10811 Right upper quadrant abdominal tenderness: Secondary | ICD-10-CM | POA: Diagnosis not present

## 2019-01-14 DIAGNOSIS — Z79899 Other long term (current) drug therapy: Secondary | ICD-10-CM | POA: Insufficient documentation

## 2019-01-14 DIAGNOSIS — R0789 Other chest pain: Secondary | ICD-10-CM | POA: Insufficient documentation

## 2019-01-14 DIAGNOSIS — R42 Dizziness and giddiness: Secondary | ICD-10-CM | POA: Insufficient documentation

## 2019-01-14 DIAGNOSIS — R079 Chest pain, unspecified: Secondary | ICD-10-CM

## 2019-01-14 LAB — CBC WITH DIFFERENTIAL/PLATELET
Abs Immature Granulocytes: 0.06 10*3/uL (ref 0.00–0.07)
Basophils Absolute: 0.1 10*3/uL (ref 0.0–0.1)
Basophils Relative: 1 %
Eosinophils Absolute: 0.3 10*3/uL (ref 0.0–0.5)
Eosinophils Relative: 5 %
HCT: 45.7 % (ref 39.0–52.0)
Hemoglobin: 14.8 g/dL (ref 13.0–17.0)
Immature Granulocytes: 1 %
Lymphocytes Relative: 22 %
Lymphs Abs: 1.2 10*3/uL (ref 0.7–4.0)
MCH: 27.6 pg (ref 26.0–34.0)
MCHC: 32.4 g/dL (ref 30.0–36.0)
MCV: 85.1 fL (ref 80.0–100.0)
Monocytes Absolute: 0.9 10*3/uL (ref 0.1–1.0)
Monocytes Relative: 16 %
Neutro Abs: 3 10*3/uL (ref 1.7–7.7)
Neutrophils Relative %: 55 %
Platelets: 253 10*3/uL (ref 150–400)
RBC: 5.37 MIL/uL (ref 4.22–5.81)
RDW: 15 % (ref 11.5–15.5)
WBC: 5.5 10*3/uL (ref 4.0–10.5)
nRBC: 0 % (ref 0.0–0.2)

## 2019-01-14 LAB — COMPREHENSIVE METABOLIC PANEL
ALT: 38 U/L (ref 0–44)
AST: 54 U/L — ABNORMAL HIGH (ref 15–41)
Albumin: 3.7 g/dL (ref 3.5–5.0)
Alkaline Phosphatase: 41 U/L (ref 38–126)
Anion gap: 12 (ref 5–15)
BUN: 10 mg/dL (ref 6–20)
CO2: 20 mmol/L — ABNORMAL LOW (ref 22–32)
Calcium: 8.9 mg/dL (ref 8.9–10.3)
Chloride: 107 mmol/L (ref 98–111)
Creatinine, Ser: 1.2 mg/dL (ref 0.61–1.24)
GFR calc Af Amer: >60 mL/min (ref >60)
GFR calc non Af Amer: >60 mL/min (ref >60)
Glucose, Bld: 83 mg/dL (ref 70–99)
Potassium: 5.7 mmol/L — ABNORMAL HIGH (ref 3.5–5.1)
Sodium: 139 mmol/L (ref 135–145)
Total Bilirubin: 1.6 mg/dL — ABNORMAL HIGH (ref 0.3–1.2)
Total Protein: 6.2 g/dL — ABNORMAL LOW (ref 6.5–8.1)

## 2019-01-14 LAB — TROPONIN I
Troponin I: <0.03 ng/mL (ref <0.03)
Troponin I: <0.03 ng/mL (ref <0.03)

## 2019-01-14 LAB — LIPASE, BLOOD: Lipase: 19 U/L (ref 11–51)

## 2019-01-14 LAB — POTASSIUM: Potassium: 3.8 mmol/L (ref 3.5–5.1)

## 2019-01-14 MED ORDER — SODIUM CHLORIDE 0.9 % IV BOLUS
1000.0000 mL | Freq: Once | INTRAVENOUS | Status: AC
  Administered 2019-01-14: 1000 mL via INTRAVENOUS

## 2019-01-14 MED ORDER — DIPHENHYDRAMINE HCL 50 MG/ML IJ SOLN
12.5000 mg | Freq: Once | INTRAMUSCULAR | Status: AC
  Administered 2019-01-14: 12.5 mg via INTRAVENOUS
  Filled 2019-01-14: qty 1

## 2019-01-14 MED ORDER — ALUM & MAG HYDROXIDE-SIMETH 200-200-20 MG/5ML PO SUSP
30.0000 mL | Freq: Once | ORAL | Status: AC
  Administered 2019-01-14: 30 mL via ORAL
  Filled 2019-01-14: qty 30

## 2019-01-14 MED ORDER — LIDOCAINE VISCOUS HCL 2 % MT SOLN
15.0000 mL | Freq: Once | OROMUCOSAL | Status: AC
  Administered 2019-01-14: 15:00:00 15 mL via ORAL
  Filled 2019-01-14: qty 15

## 2019-01-14 MED ORDER — METOCLOPRAMIDE HCL 5 MG/ML IJ SOLN
10.0000 mg | Freq: Once | INTRAMUSCULAR | Status: AC
  Administered 2019-01-14: 14:00:00 10 mg via INTRAVENOUS
  Filled 2019-01-14: qty 2

## 2019-01-14 MED ORDER — FAMOTIDINE IN NACL 20-0.9 MG/50ML-% IV SOLN
20.0000 mg | Freq: Once | INTRAVENOUS | Status: AC
  Administered 2019-01-14: 20 mg via INTRAVENOUS
  Filled 2019-01-14: qty 50

## 2019-01-14 NOTE — ED Provider Notes (Signed)
Physical Exam  BP 120/75 (BP Location: Left Arm)   Pulse 79   Temp 98.4 F (36.9 C) (Oral)   Resp 14   Ht 5\' 6"  (1.676 m)   Wt 79.4 kg   SpO2 100%   BMI 28.25 kg/m   Assumed care from [facility], PA-C at 1600. Briefly, the patient is a 53 y.o. male with PMHx of  has a past medical history of ED (erectile dysfunction), Gastroparesis, and H/O: hypertension. here with left-sided chest pain.  Patient has been placed on calcium channel blockers by his gastroenterologist for esophageal spasm and he reports that he feels that they are causing more side effects and they are helping.  Patient reports that he has a longstanding history of dysphasia and esophageal stricturing and spasming.  No history of cardiovascular disease or other major risk factors for such.  On my exam, patient reports that he feels much better.  Labs Reviewed  COMPREHENSIVE METABOLIC PANEL - Abnormal; Notable for the following components:      Result Value   Potassium 5.7 (*)    CO2 20 (*)    Total Protein 6.2 (*)    AST 54 (*)    Total Bilirubin 1.6 (*)    All other components within normal limits  CBC WITH DIFFERENTIAL/PLATELET  LIPASE, BLOOD  TROPONIN I  TROPONIN I  POTASSIUM    Course of Care:   Physical Exam Vitals signs and nursing note reviewed.  Constitutional:      General: He is not in acute distress.    Appearance: He is well-developed. He is not diaphoretic.     Comments: Sitting comfortably in bed.  HENT:     Head: Normocephalic and atraumatic.     Mouth/Throat:     Mouth: Mucous membranes are moist.  Eyes:     General:        Right eye: No discharge.        Left eye: No discharge.     Conjunctiva/sclera: Conjunctivae normal.     Comments: EOMs normal to gross examination.  Neck:     Musculoskeletal: Normal range of motion.  Cardiovascular:     Rate and Rhythm: Normal rate and regular rhythm.     Heart sounds: Normal heart sounds.  Abdominal:     General: There is no  distension.  Musculoskeletal: Normal range of motion.  Skin:    General: Skin is warm and dry.  Neurological:     Mental Status: He is alert.     Comments: Cranial nerves intact to gross observation. Patient moves extremities without difficulty.  Psychiatric:        Behavior: Behavior normal.        Thought Content: Thought content normal.        Judgment: Judgment normal.     ED Course/Procedures   Clinical Course as of Jan 14 1811  Thu Jan 14, 2019  1743 Hyperkalemia likely due to hemolysis. Normal now.   Potassium: 3.8 [AM]  1744 Delta troponin negative.   Troponin I: <0.03 [AM]    Clinical Course User Index [AM] Elisha Ponder, PA-C    Procedures  MDM   Patient well-appearing in no acute distress.  Patient chest pain-free on my evaluation.  Suspect esophageal cause of patient's chest pain given his extensive history of esophageal pathology.  Patient is delta troponin negative.  EKG nonischemic appearing.  Initial BMP demonstrated hyperkalemia however is hemolyzed.  Repeat is normal.  Patient stable for follow-up with his  primary care provider and gastroenterologist.  Patient given return precautions for any worsening pain, shortness of breath or chest pain or exertional chest pain.  Patient is in understanding and agrees with plan of care.       Elisha PonderMurray, Camella Seim B, PA-C 01/14/19 1815    Tilden Fossaees, Elizabeth, MD 01/15/19 [phone]

## 2019-01-14 NOTE — ED Provider Notes (Signed)
[facility] EMERGENCY DEPARTMENT Provider Note   CSN: [id number] Arrival date & time: 01/14/19  1328    History   Chief Complaint Chief Complaint  Patient presents with  . Chest Pain    HPI Logan Arnold is a 53 y.o. male who presents to the emergency department via EMS with complaints of chest pain which began at 0800 this morning.  States pain is located in the left chest and upper abdomen, describes pain as stabbing, currently a 3/10 in severity, seems to be occurring intermittently lasting minutes at a time. Pain started after eating a sandwich from [employer]. Has not had specific alleviating/aggravating factors. No change in exertion or deep breath. He notes hx of similar intermittent pain over the past month and again several years ago. Has been seeing GI, was told he has esophageal spasms which they are attempting to manage medically- was placed on CCB 2 weeks prior, took for 2 days then discontinued without consulting w/ his GI provider because he did not like how it made him feel. This feels similar to his prior pain, but seemed to happen for longer and hut more prompting Er visit. He took 324 of ASA PTA. EMS noted BP to be elevated 180/100, given 2 NTG without much change in BP or change in patient's pain per their report to nursing staff. Patient denies change in pain w/ NTG. He notes that he also has a headache- gradual onset, steady progression, frontal, similar to prior migraines w/ some lightheadedness when changing positions.  Denies fever, chills, nausea, vomiting, diaphoresis, dyspnea, leg pain/swelling, diarrhea, numbness, weakness, dizziness, or syncope.      HPI  Past Medical History:  Diagnosis Date  . ED (erectile dysfunction)   . Gastroparesis   . H/O: hypertension    AND HYPERLIPID--  NO MEDS SINCE 2012 DUE TO WT LOSS    There are no active problems to display for this patient.   Past Surgical History:  Procedure Laterality Date  .  EXPLORATORY LAPAROTOMY  AUG 2013   W/ LYSIS ADHESIONS FOR BOWEL OBSTRUCTION  . LAPAROSCOPIC CHOLECYSTECTOMY  01-17-2011  . LAPAROSCOPIC HELLER MYOTOMY  7/13  . PENILE PROSTHESIS IMPLANT N/A 11/09/2012   Procedure: PENILE PROTHESIS INFLATABLE;  Surgeon: Marcine Matar, MD;  Location: [facility];  Service: Urology;  Laterality: N/A;  PLACEMENT OF COLOPLAST 3 PIECE INFLATABLE PENILE PROSTHESIS  "TITAN TOUCH"          Home Medications    Prior to Admission medications   Medication Sig Start Date End Date Taking? Authorizing Provider  HYDROcodone-acetaminophen (NORCO/VICODIN) 5-325 MG per tablet Take 1-2 tablets by mouth every 6 (six) hours as needed (for pain). 12/14/14   Molpus, John, MD  ondansetron (ZOFRAN) 4 MG tablet Take 4 mg by mouth every 8 (eight) hours as needed for nausea or vomiting.    [provider]  pantoprazole (PROTONIX) 20 MG tablet Take 20 mg by mouth daily.    [provider]  temazepam (RESTORIL) 30 MG capsule Take 1 capsule (30 mg total) by mouth at bedtime as needed for sleep. 06/17/15   Cartner, Sharlet Salina, PA-C  traMADol (ULTRAM) 50 MG tablet Take 1 tablet (50 mg total) by mouth every 6 (six) hours as needed for pain. 06/19/13   Pisciotta, Joni Reining, PA-C  metoprolol tartrate (LOPRESSOR) 25 MG tablet Take 25 mg by mouth 2 (two) times daily.  12/14/14  [provider]  mirtazapine (REMERON) 15 MG tablet Take 15 mg by mouth  at bedtime.    12/14/14  [provider]    Family History No family history on file.  Social History Social History   Tobacco Use  . Smoking status: Never Smoker  Substance Use Topics  . Alcohol use: No  . Drug use: No     Allergies   Patient has no known allergies.   Review of Systems Review of Systems  Constitutional: Negative for chills and fever.  Eyes: Negative for visual disturbance.  Respiratory: Negative for cough and shortness of breath.   Cardiovascular: Positive for chest  pain.  Gastrointestinal: Positive for abdominal pain. Negative for blood in stool, constipation, diarrhea, nausea and vomiting.  Neurological: Positive for light-headedness and headaches. Negative for dizziness, syncope, facial asymmetry, weakness and numbness.  All other systems reviewed and are negative.   Physical Exam Updated Vital Signs BP (!) 148/86 (BP Location: Left Arm)   Pulse 79   Temp 98.4 F (36.9 C) (Oral)   Resp 17   Ht 5\' 6"  (1.676 m)   Wt 79.4 kg   SpO2 100%   BMI 28.25 kg/m   Physical Exam Vitals signs and nursing note reviewed.  Constitutional:      General: He is not in acute distress.    Appearance: He is well-developed. He is not toxic-appearing.  HENT:     Head: Normocephalic and atraumatic.  Eyes:     General:        Right eye: No discharge.        Left eye: No discharge.     Extraocular Movements: Extraocular movements intact.     Conjunctiva/sclera: Conjunctivae normal.     Pupils: Pupils are equal, round, and reactive to light.  Neck:     Musculoskeletal: Neck supple. No neck rigidity.  Cardiovascular:     Rate and Rhythm: Normal rate and regular rhythm.     Pulses:          Radial pulses are 2+ on the right side and 2+ on the left side.       Dorsalis pedis pulses are 2+ on the right side and 2+ on the left side.  Pulmonary:     Effort: Pulmonary effort is normal. No respiratory distress.     Breath sounds: Normal breath sounds. No wheezing, rhonchi or rales.  Chest:     Chest wall: Tenderness (left anterior chest wall) present. No deformity, swelling, crepitus or edema.  Abdominal:     General: There is no distension.     Palpations: Abdomen is soft.     Tenderness: There is abdominal tenderness (mild to diffuse upper abdomen). There is no guarding or rebound. Negative signs include Murphy's sign and McBurney's sign.  Skin:    General: Skin is warm and dry.     Findings: No rash.  Neurological:     Mental Status: He is alert.      Comments: Alert. Clear speech. No facial droop. CNIII-XII grossly intact. Bilateral upper and lower extremities' sensation grossly intact. 5/5 symmetric strength with grip strength and with plantar and dorsi flexion bilaterally. Normal finger to nose bilaterally. Negative pronator drift.  Psychiatric:        Behavior: Behavior normal.    ED Treatments / Results  Labs (all labs ordered are listed, but only abnormal results are displayed) Labs Reviewed  COMPREHENSIVE METABOLIC PANEL - Abnormal; Notable for the following components:      Result Value   Potassium 5.7 (*)    CO2 20 (*)  Total Protein 6.2 (*)    AST 54 (*)    Total Bilirubin 1.6 (*)    All other components within normal limits  CBC WITH DIFFERENTIAL/PLATELET  LIPASE, BLOOD  TROPONIN I    EKG EKG Interpretation  Date/Time:  Thursday January 14 2019 13:41:39 EDT Ventricular Rate:  74 PR Interval:    QRS Duration: 87 QT Interval:  378 QTC Calculation: 420 R Axis:   -84 Text Interpretation:  Sinus rhythm Left anterior fascicular block Low voltage, extremity leads Probable anteroseptal infarct, old Borderline ST elevation, lateral leads No significant change since last tracing Confirmed by Tilden Fossaees, Elizabeth [phone] on 01/14/2019 2:22:12 PM   Radiology Dg Chest 2 View  Result Date: 01/14/2019 CLINICAL DATA:  Chest pain. EXAM: CHEST - 2 VIEW COMPARISON:  Chest x-ray dated July 26, 2011. FINDINGS: The heart size and mediastinal contours are within normal limits. Low lung volumes. Both lungs are clear. The visualized skeletal structures are unremarkable. IMPRESSION: No active cardiopulmonary disease. Electronically Signed   By: Obie DredgeWilliam T Derry M.D.   On: 01/14/2019 14:50    Procedures Procedures (including critical care time)  Medications Ordered in ED Medications - No data to display   Initial Impression / Assessment and Plan / ED Course  I have reviewed the triage vital signs and the nursing notes.  Pertinent  labs & imaging results that were available during my care of the patient were reviewed by me and considered in my medical decision making (see chart for details).   Patient presents to the ED via EMS with complaints of chest pain. Nontoxic appearing, vitals WNL with the exception of elevated BP- low suspicion for HTN emergency. Exam w/ L anterior chest wall & upper abdominal tenderness without peritoneal signs. DDX: ACS, pulmonary embolism, dissection, pneumothorax, pancreatitis, GERD, esophageal spasm, anemia, electrolyte derangement, MSK, anxiety. Evaluation initiated with labs, EKG, and CXR. Patient on cardiac monitor. His headache is similar to prior migraines, no sudden onset, no neuro deficits will give migraine cocktail.   Work-up in the ER reviewed:  CBC: No anemia/leukocytosis CMP: K elevated @ 5.7- hemolysis, no EKG changes- will recheck w/ delta trop. Minimal elevation in AST/t.bili. otherwise unremarkable Lipase: WNL- doubt pancreatitis.  Troponin: initial negative EKG: No significant change from prior.  CXR: Negative, without infiltrate, effusion, pneumothorax, or fracture/dislocation.   Patient is low risk wells, doubt pulmonary embolism. Pain is not a tearing sensation, symmetric pulses, no widening of mediastinum on CXR, doubt dissection.   15:30: RE-EVAL: Patient feeling improved following medications administered in the ER. States he thinks this is probably his esophageal issues, resting comfortably.   16:00: [facility] at change of shift pending delta trop w/ repeat potassium. If WNL anticipate discharge home with GI and cardiology follow up.  Findings and plan of care discussed with supervising physician Dr. Madilyn Hookees who is in agreement.   Final Clinical Impressions(s) / ED Diagnoses   Final diagnoses:  Chest pain, unspecified type    ED Discharge Orders    None       Cherly Andersonetrucelli, Amanii Snethen R, PA-C 01/14/19 1540    Tilden Fossaees, Elizabeth,  MD 01/15/19 1625

## 2019-01-14 NOTE — ED Notes (Signed)
Patient transported to X-ray 

## 2019-01-14 NOTE — Discharge Instructions (Addendum)
You were seen in the emergency department today for chest pain. Your work-up in the emergency department has been overall reassuring. Your labs have been fairly normal and or similar to previous blood work you have had done. Your AST and total bilirubin were mildly elevated- this can be followed up by your GI doctor. Your EKG and the enzyme we use to check your heart did not show an acute heart attack at this time. Your chest x-ray was normal.   We would like you to follow up closely with your primary care provider, GI doctor, and tje cardiologist provided in your discharge instructions within 1-3 days. Return to the ER immediately should you experience any new or worsening symptoms including but not limited to return of pain, worsened pain, vomiting, shortness of breath, dizziness, lightheadedness, passing out, or any other concerns that you may have.

## 2019-01-14 NOTE — ED Notes (Signed)
Received report from Milner, Charity fundraiser.

## 2019-01-14 NOTE — ED Triage Notes (Signed)
Pt BIB [facility] from home. Pt complaining of chest pain that started this AM. Pt complaining of increased pain upon hr increase. Pt [employer], very active.  Pt normal resting heart rate around 50. HR into 100s today. Pt has been seen at [facility] for GI and esophageal issues. Pt started on calcium-channel blockers and this is when chest pain started. Chest pain typically goes away. Pt stopped calcium channel blockers a few days ago. Initial BP 120/80 manual with EMS. Pt BP went up to 180/100 with EMS, pt given nitro x2 with no relief.

## 2019-01-14 NOTE — ED Notes (Signed)
Rounded on patient and asked to be able to share health information with wife. Pt declined stating his wife was under a lot of stress currently and he would call her with his cellphone.

## 2019-01-14 NOTE — ED Notes (Signed)
Patient verbalizes understanding of discharge instructions. Opportunity for questioning and answers were provided. Armband removed by staff, pt discharged from ED.  

## 2020-03-11 IMAGING — DX CHEST - 2 VIEW
2 series · 2 of 2 positions shown · non-contrast
Comparison: Chest x-ray dated July 26, 2011.

CLINICAL DATA: Chest pain.

EXAM:
CHEST - 2 VIEW

[chest ap]
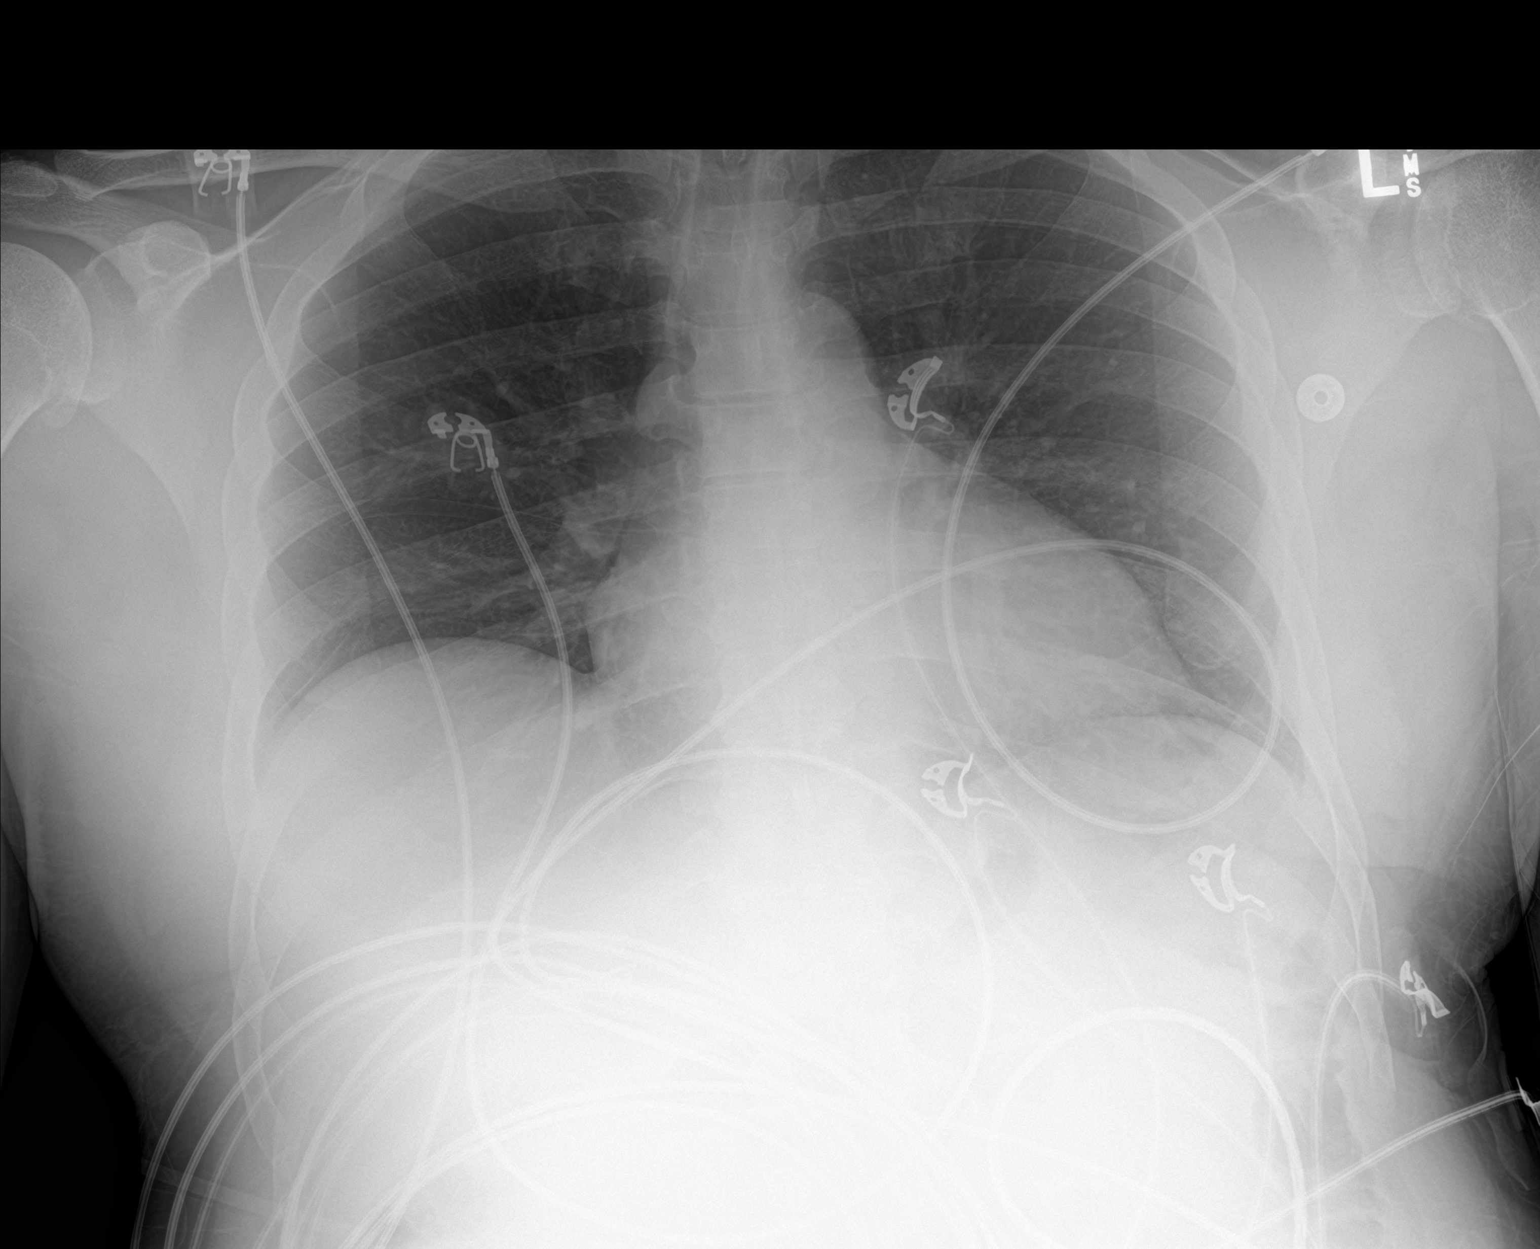

[chest lat]
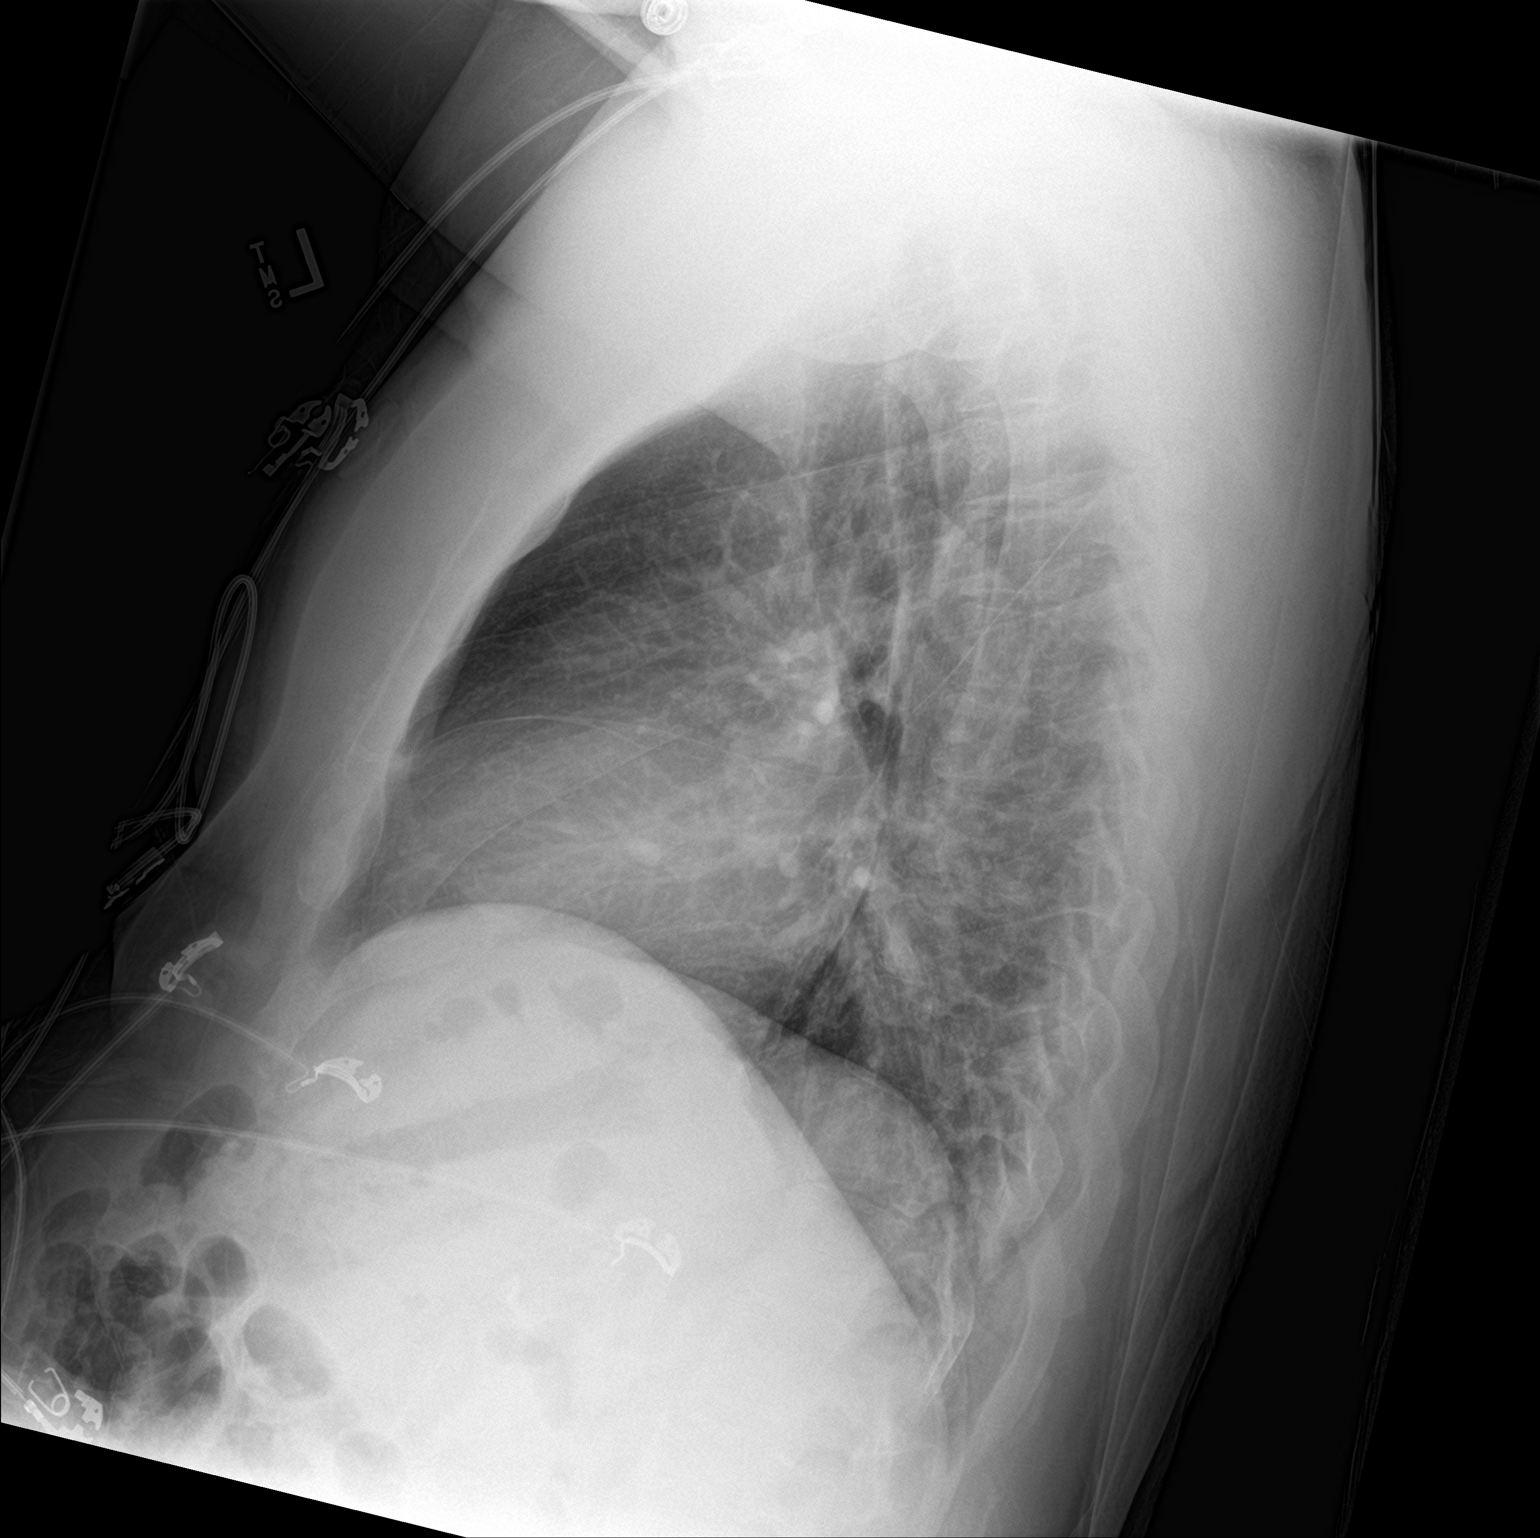

[2 of 2 positions shown; findings below may reference images not displayed]

FINDINGS: The heart size and mediastinal contours are within normal limits.
Low lung volumes. Both lungs are clear. The visualized skeletal
structures are unremarkable.
IMPRESSION: No active cardiopulmonary disease.

## 2020-07-20 ENCOUNTER — Ambulatory Visit: Payer: 59 | Attending: Internal Medicine

## 2020-07-20 DIAGNOSIS — Z23 Encounter for immunization: Secondary | ICD-10-CM

## 2020-07-20 NOTE — Progress Notes (Signed)
   Covid-19 Vaccination Clinic  Name:  Antavius Sperbeck    MRN: [id number] DOB: 1966/07/28  07/20/2020  Mr. Cassada was observed post Covid-19 immunization for 15 minutes without incident. He was provided with Vaccine Information Sheet and instruction to access the V-Safe system.   Mr. Vaeth was instructed to call 911 with any severe reactions post vaccine: Marland Kitchen Difficulty breathing  . Swelling of face and throat  . A fast heartbeat  . A bad rash all over body  . Dizziness and weakness   Immunizations Administered    Name Date Dose VIS Date Route   JANSSEN COVID-19 VACCINE 07/20/2020  9:18 AM 0.5 mL 11/20/2019 Intramuscular   Manufacturer: [employer]   Lot: [id number]   NDC: 30092-330-07

## 2020-07-27 ENCOUNTER — Other Ambulatory Visit: Payer: Self-pay | Admitting: *Deleted

## 2020-07-27 ENCOUNTER — Other Ambulatory Visit: Payer: Self-pay

## 2020-07-27 ENCOUNTER — Other Ambulatory Visit: Payer: 59

## 2020-07-27 DIAGNOSIS — Z20822 Contact with and (suspected) exposure to covid-19: Secondary | ICD-10-CM

## 2020-07-28 LAB — SARS-COV-2, NAA 2 DAY TAT

## 2020-07-28 LAB — NOVEL CORONAVIRUS, NAA: SARS-CoV-2, NAA: NOT DETECTED

## 2021-03-20 ENCOUNTER — Encounter (HOSPITAL_COMMUNITY): Payer: Self-pay | Admitting: Emergency Medicine

## 2021-03-20 ENCOUNTER — Emergency Department (HOSPITAL_COMMUNITY)
Admission: EM | Admit: 2021-03-20 | Discharge: 2021-03-20 | Disposition: A | Discharge status: Home / Self Care | Payer: 59 | Attending: Emergency Medicine | Admitting: Emergency Medicine

## 2021-03-20 DIAGNOSIS — I10 Essential (primary) hypertension: Secondary | ICD-10-CM | POA: Insufficient documentation

## 2021-03-20 DIAGNOSIS — R6884 Jaw pain: Secondary | ICD-10-CM | POA: Diagnosis present

## 2021-03-20 DIAGNOSIS — K115 Sialolithiasis: Secondary | ICD-10-CM

## 2021-03-20 NOTE — Discharge Instructions (Addendum)
Buy sialogogues such as lemon drops or other sour candies/food to help increase saliva production.   Please follow up with the ear nose and throat team within the next 5-7 days for reassessment.   Please return to the emergency department for any new or worsening symptoms.

## 2021-03-20 NOTE — ED Provider Notes (Addendum)
[facility] EMERGENCY DEPARTMENT Provider Note   CSN: [id number] Arrival date & time: 03/20/21  1419     History No chief complaint on file.   Logan Arnold is a 55 y.o. male.  HPI  Patient is a 55 year old male who presents to the emergency department today for evaluation of swelling to the right side of the jaw that started suddenly just prior to arrival.  He was eating a [place] burger prior to arrival when symptoms started.  He has some pain to that area.  He denies any dental pain at this time.  He has never had similar symptoms before but has had a jaw dislocation previously and wonders if this could be related.  Past Medical History:  Diagnosis Date   ED (erectile dysfunction)    Gastroparesis    H/O: hypertension    AND HYPERLIPID--  NO MEDS SINCE 2012 DUE TO WT LOSS    There are no problems to display for this patient.   Past Surgical History:  Procedure Laterality Date   EXPLORATORY LAPAROTOMY  AUG 2013   W/ LYSIS ADHESIONS FOR BOWEL OBSTRUCTION   LAPAROSCOPIC CHOLECYSTECTOMY  01-17-2011   LAPAROSCOPIC HELLER MYOTOMY  7/13   PENILE PROSTHESIS IMPLANT N/A 11/09/2012   Procedure: PENILE PROTHESIS INFLATABLE;  Surgeon: Marcine Matar, MD;  Location: [facility];  Service: Urology;  Laterality: N/A;  PLACEMENT OF COLOPLAST 3 PIECE INFLATABLE PENILE PROSTHESIS  "TITAN TOUCH"         No family history on file.  Social History   Tobacco Use   Smoking status: Never   Smokeless tobacco: Never  Substance Use Topics   Alcohol use: No   Drug use: No    Home Medications Prior to Admission medications   Medication Sig Start Date End Date Taking? Authorizing Provider  dicyclomine (BENTYL) 10 MG capsule Take 20 mg by mouth 4 (four) times daily as needed for spasms. Before meals and nightly 12/03/18   [provider]  HYDROcodone-acetaminophen (NORCO/VICODIN) 5-325 MG per tablet Take 1-2 tablets by mouth every 6 (six)  hours as needed (for pain). Patient not taking: Reported on 01/14/2019 12/14/14   Molpus, Jonny Ruiz, MD  ondansetron (ZOFRAN) 4 MG tablet Take 4 mg by mouth every 8 (eight) hours as needed for nausea or vomiting.    [provider]  pantoprazole (PROTONIX) 40 MG tablet Take 40 mg by mouth 2 (two) times daily.     [provider]  temazepam (RESTORIL) 30 MG capsule Take 1 capsule (30 mg total) by mouth at bedtime as needed for sleep. Patient not taking: Reported on 01/14/2019 06/17/15   Joycie Peek, PA-C  traMADol (ULTRAM) 50 MG tablet Take 1 tablet (50 mg total) by mouth every 6 (six) hours as needed for pain. Patient not taking: Reported on 01/14/2019 06/19/13   Pisciotta, Joni Reining, PA-C  traMADol (ULTRAM) 50 MG tablet Take 50 mg by mouth every 8 (eight) hours as needed for pain. 12/23/18   [provider]    Allergies    Patient has no allergy information on record.  Review of Systems   Review of Systems  Constitutional:  Negative for chills and fever.  HENT:  Negative for ear pain and sore throat.        Jaw swelling  Eyes:  Negative for visual disturbance.  Respiratory:  Negative for cough and shortness of breath.   Cardiovascular:  Negative for chest pain.  Gastrointestinal:  Negative for abdominal pain, constipation, diarrhea,  nausea and vomiting.  Genitourinary:  Negative for dysuria and hematuria.  Musculoskeletal:  Negative for back pain.  Skin:  Negative for rash.  Neurological:  Negative for headaches.  All other systems reviewed and are negative.  Physical Exam Updated Vital Signs BP (!) 150/87   Pulse 95   Temp 98.9 F (37.2 C) (Oral)   Resp (!) 22   SpO2 100%   Physical Exam Constitutional:      General: He is not in acute distress.    Appearance: He is well-developed.  HENT:     Mouth/Throat:     Comments: Swelling and ttp noted over the right parotid gland. There appears to be a white punctate mass at the opening of the paritod salivary  gland within the mouth. No ttp to the teeth and no concerns regarding dentition. No trismus Eyes:     Conjunctiva/sclera: Conjunctivae normal.  Cardiovascular:     Rate and Rhythm: Normal rate.  Pulmonary:     Effort: Pulmonary effort is normal.  Skin:    General: Skin is warm and dry.  Neurological:     Mental Status: He is alert and oriented to person, place, and time.    ED Results / Procedures / Treatments   Labs (all labs ordered are listed, but only abnormal results are displayed) Labs Reviewed - No data to display  EKG None  Radiology No results found.  Procedures Procedures   Medications Ordered in ED Medications - No data to display  ED Course  I have reviewed the triage vital signs and the nursing notes.  Pertinent labs & imaging results that were available during my care of the patient were reviewed by me and considered in my medical decision making (see chart for details).    MDM Rules/Calculators/A&P                          55 year old male presenting the emergency department today for evaluation of swelling to the right parotid gland that started just prior to arrival.  Suspect symptoms related to sialolithiasis.  Recommended sialagogues and close ENT follow-up.  Advised on specific return precautions.  He does not appear to have any signs of infection at this time so hold on antibiotics but he understands to return if experience any signs of infection.  All questions were answered.  Patient stable for discharge  Final Clinical Impression(s) / ED Diagnoses Final diagnoses:  Sialolithiasis    Rx / DC Orders ED Discharge Orders     None        Karrie Meres, PA-C 03/20/21 1438    Cheray Pardi S, PA-C 03/20/21 1438    Gwyneth Sprout, MD 03/22/21 1228

## 2021-03-20 NOTE — ED Notes (Signed)
Seen by PA at triage and discharged.

## 2021-03-20 NOTE — ED Triage Notes (Signed)
Pt has salivary gland swelling that he noticed after eating today.  Denies fever, chills, or any other symptoms.
